# Patient Record
Sex: Female | Born: 1970 | Race: Black or African American | Hispanic: No | Marital: Married | Smoking: Never smoker
Health system: Southern US, Community
[De-identification: ages and names within clinical notes are randomized; demographics above are authoritative.]

## PROBLEM LIST (undated history)

## (undated) DIAGNOSIS — D649 Anemia, unspecified: Secondary | ICD-10-CM

## (undated) DIAGNOSIS — Z973 Presence of spectacles and contact lenses: Secondary | ICD-10-CM

## (undated) DIAGNOSIS — N939 Abnormal uterine and vaginal bleeding, unspecified: Secondary | ICD-10-CM

## (undated) DIAGNOSIS — K219 Gastro-esophageal reflux disease without esophagitis: Secondary | ICD-10-CM

## (undated) DIAGNOSIS — R51 Headache: Secondary | ICD-10-CM

## (undated) DIAGNOSIS — M26609 Unspecified temporomandibular joint disorder, unspecified side: Secondary | ICD-10-CM

## (undated) DIAGNOSIS — J309 Allergic rhinitis, unspecified: Secondary | ICD-10-CM

## (undated) HISTORY — DX: Headache: R51

---

## 1997-05-27 HISTORY — PX: DILATION AND CURETTAGE OF UTERUS: SHX78

## 2000-06-13 ENCOUNTER — Encounter: Payer: Self-pay | Admitting: *Deleted

## 2000-06-13 ENCOUNTER — Ambulatory Visit (HOSPITAL_COMMUNITY): Admission: RE | Admit: 2000-06-13 | Discharge: 2000-06-13 | Payer: Self-pay | Admitting: *Deleted

## 2000-09-26 ENCOUNTER — Inpatient Hospital Stay (HOSPITAL_COMMUNITY): Admission: AD | Admit: 2000-09-26 | Discharge: 2000-09-26 | Payer: Self-pay | Admitting: *Deleted

## 2000-10-12 ENCOUNTER — Inpatient Hospital Stay (HOSPITAL_COMMUNITY): Admission: AD | Admit: 2000-10-12 | Discharge: 2000-10-12 | Payer: Self-pay | Admitting: *Deleted

## 2000-10-21 ENCOUNTER — Inpatient Hospital Stay (HOSPITAL_COMMUNITY): Admission: AD | Admit: 2000-10-21 | Discharge: 2000-10-24 | Payer: Self-pay | Admitting: *Deleted

## 2004-04-13 ENCOUNTER — Ambulatory Visit: Payer: Self-pay | Admitting: Internal Medicine

## 2004-05-08 ENCOUNTER — Ambulatory Visit: Payer: Self-pay | Admitting: Internal Medicine

## 2005-02-15 ENCOUNTER — Ambulatory Visit: Payer: Self-pay | Admitting: Internal Medicine

## 2005-03-11 ENCOUNTER — Other Ambulatory Visit: Admission: RE | Admit: 2005-03-11 | Discharge: 2005-03-11 | Payer: Self-pay | Admitting: Obstetrics and Gynecology

## 2006-01-17 ENCOUNTER — Ambulatory Visit: Payer: Self-pay | Admitting: Internal Medicine

## 2006-04-22 ENCOUNTER — Ambulatory Visit: Payer: Self-pay | Admitting: Internal Medicine

## 2006-07-04 ENCOUNTER — Encounter: Admission: RE | Admit: 2006-07-04 | Discharge: 2006-07-04 | Payer: Self-pay | Admitting: Obstetrics and Gynecology

## 2006-08-06 ENCOUNTER — Ambulatory Visit: Payer: Self-pay | Admitting: Internal Medicine

## 2006-08-06 LAB — CONVERTED CEMR LAB
ALT: 13 units/L (ref 0–40)
AST: 18 units/L (ref 0–37)
Albumin: 3.6 g/dL (ref 3.5–5.2)
Alkaline Phosphatase: 94 units/L (ref 39–117)
BUN: 9 mg/dL (ref 6–23)
Basophils Absolute: 0.1 10*3/uL (ref 0.0–0.1)
Basophils Relative: 0.9 % (ref 0.0–1.0)
Bilirubin Urine: NEGATIVE
Bilirubin, Direct: 0.1 mg/dL (ref 0.0–0.3)
CO2: 32 meq/L (ref 19–32)
Calcium: 8.8 mg/dL (ref 8.4–10.5)
Chloride: 101 meq/L (ref 96–112)
Creatinine, Ser: 0.8 mg/dL (ref 0.4–1.2)
Eosinophils Absolute: 0.1 10*3/uL (ref 0.0–0.6)
Eosinophils Relative: 1 % (ref 0.0–5.0)
GFR calc Af Amer: 104 mL/min
GFR calc non Af Amer: 86 mL/min
Glucose, Bld: 93 mg/dL (ref 70–99)
H Pylori IgG: NEGATIVE
HCT: 38.2 % (ref 36.0–46.0)
Hemoglobin: 13 g/dL (ref 12.0–15.0)
Ketones, ur: NEGATIVE mg/dL
Leukocytes, UA: NEGATIVE
Lymphocytes Relative: 20.7 % (ref 12.0–46.0)
MCHC: 34.1 g/dL (ref 30.0–36.0)
MCV: 88 fL (ref 78.0–100.0)
Monocytes Absolute: 0.8 10*3/uL — ABNORMAL HIGH (ref 0.2–0.7)
Monocytes Relative: 11.1 % — ABNORMAL HIGH (ref 3.0–11.0)
Neutro Abs: 4.6 10*3/uL (ref 1.4–7.7)
Neutrophils Relative %: 66.3 % (ref 43.0–77.0)
Nitrite: NEGATIVE
Platelets: 396 10*3/uL (ref 150–400)
Potassium: 3.7 meq/L (ref 3.5–5.1)
RBC: 4.34 M/uL (ref 3.87–5.11)
RDW: 12.4 % (ref 11.5–14.6)
Sed Rate: 40 mm/hr — ABNORMAL HIGH (ref 0–25)
Sodium: 139 meq/L (ref 135–145)
Specific Gravity, Urine: 1.02 (ref 1.000–1.03)
Total Bilirubin: 0.6 mg/dL (ref 0.3–1.2)
Total Protein, Urine: NEGATIVE mg/dL
Total Protein: 7.2 g/dL (ref 6.0–8.3)
Urine Glucose: NEGATIVE mg/dL
Urobilinogen, UA: 1 (ref 0.0–1.0)
WBC: 7.1 10*3/uL (ref 4.5–10.5)
hCG, Beta Chain, Quant, S: <0.5 milliintl units/mL
pH: 7.5 (ref 5.0–8.0)

## 2006-09-23 ENCOUNTER — Ambulatory Visit: Payer: Self-pay | Admitting: Internal Medicine

## 2006-12-16 ENCOUNTER — Ambulatory Visit: Payer: Self-pay | Admitting: Internal Medicine

## 2007-04-04 ENCOUNTER — Encounter: Payer: Self-pay | Admitting: *Deleted

## 2007-04-04 DIAGNOSIS — J309 Allergic rhinitis, unspecified: Secondary | ICD-10-CM | POA: Insufficient documentation

## 2007-07-15 ENCOUNTER — Telehealth: Payer: Self-pay | Admitting: Internal Medicine

## 2007-12-16 ENCOUNTER — Ambulatory Visit: Payer: Self-pay | Admitting: Endocrinology

## 2007-12-16 DIAGNOSIS — H1010 Acute atopic conjunctivitis, unspecified eye: Secondary | ICD-10-CM

## 2008-05-05 ENCOUNTER — Ambulatory Visit: Payer: Self-pay | Admitting: Internal Medicine

## 2008-05-05 DIAGNOSIS — J018 Other acute sinusitis: Secondary | ICD-10-CM

## 2008-08-12 ENCOUNTER — Ambulatory Visit: Payer: Self-pay | Admitting: Internal Medicine

## 2008-08-12 DIAGNOSIS — J069 Acute upper respiratory infection, unspecified: Secondary | ICD-10-CM | POA: Insufficient documentation

## 2008-08-12 DIAGNOSIS — K219 Gastro-esophageal reflux disease without esophagitis: Secondary | ICD-10-CM | POA: Insufficient documentation

## 2008-08-12 DIAGNOSIS — K5900 Constipation, unspecified: Secondary | ICD-10-CM | POA: Insufficient documentation

## 2008-08-12 LAB — CONVERTED CEMR LAB
BUN: 10 mg/dL (ref 6–23)
CO2: 30 meq/L (ref 19–32)
Calcium: 9 mg/dL (ref 8.4–10.5)
Chloride: 107 meq/L (ref 96–112)
Creatinine, Ser: 0.9 mg/dL (ref 0.4–1.2)
GFR calc non Af Amer: 89.27 mL/min (ref >60)
Glucose, Bld: 93 mg/dL (ref 70–99)
Potassium: 4.5 meq/L (ref 3.5–5.1)
Sodium: 142 meq/L (ref 135–145)
TSH: 1.07 microintl units/mL (ref 0.35–5.50)

## 2009-01-03 ENCOUNTER — Telehealth: Payer: Self-pay | Admitting: Family Medicine

## 2009-02-02 ENCOUNTER — Telehealth: Payer: Self-pay | Admitting: Internal Medicine

## 2009-02-10 ENCOUNTER — Encounter: Payer: Self-pay | Admitting: Internal Medicine

## 2010-02-26 ENCOUNTER — Ambulatory Visit: Payer: Self-pay | Admitting: Internal Medicine

## 2010-02-26 ENCOUNTER — Telehealth (INDEPENDENT_AMBULATORY_CARE_PROVIDER_SITE_OTHER): Payer: Self-pay | Admitting: *Deleted

## 2010-02-26 DIAGNOSIS — R519 Headache, unspecified: Secondary | ICD-10-CM | POA: Insufficient documentation

## 2010-02-26 DIAGNOSIS — H5310 Unspecified subjective visual disturbances: Secondary | ICD-10-CM | POA: Insufficient documentation

## 2010-02-26 DIAGNOSIS — R51 Headache: Secondary | ICD-10-CM

## 2010-05-27 HISTORY — PX: BUNIONECTOMY: SHX129

## 2010-06-17 ENCOUNTER — Encounter: Payer: Self-pay | Admitting: Obstetrics and Gynecology

## 2010-06-26 NOTE — Progress Notes (Signed)
Summary: NEEDS OV TODAY - MONDAY  Phone Note Call from Patient Call back at [facility] Phone [phone]   Summary of Call: Pt c/o h/a this weekend. Also has c/o blurred vision in left eye. Pt left vm unsure if she needs office visit. Please help schedule w/plot today for eval. THANKS Initial call taken by: Lamar Sprinkles, CMA,  February 26, 2010 10:25 AM  Follow-up for Phone Call        Appt 10/3 @4 :15 Follow-up by: Verdell Face,  February 26, 2010 11:21 AM

## 2010-06-26 NOTE — Assessment & Plan Note (Signed)
Summary: per phone note/headache/cd   Vital Signs:  Patient profile:   40 year old female Temp:     98.4 degrees F oral Pulse rate:   82 / minute BP sitting:   140 / 90  (left arm) Cuff size:   large  Vitals Entered By: Lamar Sprinkles, CMA (February 26, 2010 4:32 PM) CC: H/a & blurred vision Comments Pt needs refill of aciphex and maxalt. Will check tetanus status with OBGYN. Pt does not think she has an allergy to ASPRIN, has taken asprin otc over the last year.    Primary Care Provider:  Tresa Garter MD  CC:  H/a & blurred vision.  History of Present Illness: C/o L eye pain x 2 d with a HA and blurred vision x2 d. HA on L started first; blurred vision developed in 1 day. No n/v  Allergies: 1)  ! 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    Physical Exam  General:  alert and well-developed.   Eyes:  vision grossly intact, pupils equal, pupils round, pupils reactive to light, no injection, and no nystagmus.  Not firm to palpation, NT. Contacts are in. Fluorescein dye exam with no erosion Nose:  no nasal discharge, mucosal pallor and mucosal erythema.   Mouth:  no pharyngeal erythema and fair dentition.   Lungs:  normal respiratory effort and normal breath sounds.   Heart:  normal rate and regular rhythm.   Skin:  turgor normal, color normal, no rashes, no suspicious lesions, no ecchymoses, no petechiae, no purpura, no ulcerations, and no edema.     Impression & Recommendations:  Problem # 1:  VISUAL CHANGES (ICD-368.10) L Assessment New Empiric antibiotic/steroid eye gtt; Acyclovir Remove  contacts Orders: Ophthalmology Referral (Ophthalmology) ASAP  Problem # 2:  HEADACHE (ICD-784.0) L Assessment: New  Her updated medication list for this problem includes:    Maxalt 10 Mg Tabs (Rizatriptan benzoate) .Marland Kitchen... Take as needed for migraines.    Hydrocodone-acetaminophen 5-325 Mg Tabs (Hydrocodone-acetaminophen) .Marland Kitchen... 1-2 by mouth two times a day as needed pain  Complete Medication List: 1)  Maxalt 10 Mg Tabs (Rizatriptan benzoate) .... Take as needed for migraines. 2)  Cetirizine Hcl 10 Mg Tabs (Cetirizine hcl) .Marland Kitchen.. 1 by mouth once daily as needed allergies 3)  Aciphex 20 Mg Tbec (Rabeprazole sodium) .... Take 1 tab every morning 4)  Acyclovir 400 Mg Tabs (Acyclovir) .Marland Kitchen.. 1 by mouth three times a day as needed herpes x 7 d 5)  Hydrocodone-acetaminophen 5-325 Mg Tabs (Hydrocodone-acetaminophen) .Marland Kitchen.. 1-2 by mouth two times a day as needed pain 6)  Tobradex 0.3-0.1 % Susp (Tobramycin-dexamethasone) .Marland Kitchen.. 1 gtt in l eye q 4 h  Patient Instructions: 1)  See your eye doctor tomorrow 2)  Remove contact lense 3)  Call if you are not better in a reasonable amount of time or if worse. Go to ER if feeling really bad!  Prescriptions: TOBRADEX 0.3-0.1 % SUSP (TOBRAMYCIN-DEXAMETHASONE) 1 gtt in L eye q 4 h  #1 x 0   Entered and Authorized by:   Tresa Garter MD   Signed by:   Tresa Garter MD on 02/26/2010   Method used:   Print  then Give to Patient   RxID:   [id number] HYDROCODONE-ACETAMINOPHEN 5-325 MG TABS (HYDROCODONE-ACETAMINOPHEN) 1-2 by mouth two times a day as needed pain  #20 x 0   Entered and Authorized by:   Tresa Garter MD   Signed by:   Tresa Garter MD on 02/26/2010   Method used:   Print then Give to Patient   RxID:   [id number] ACYCLOVIR 400 MG TABS (ACYCLOVIR) 1 by mouth three times a day as needed herpes x 7 d  #21 x 3   Entered and Authorized by:   Tresa Garter MD   Signed by:   Tresa Garter MD on 02/26/2010   Method  used:   Print then Give to Patient   RxID:   [id number]

## 2010-07-26 ENCOUNTER — Ambulatory Visit (INDEPENDENT_AMBULATORY_CARE_PROVIDER_SITE_OTHER)
Admission: RE | Admit: 2010-07-26 | Discharge: 2010-07-26 | Disposition: A | Discharge status: Home / Self Care | Payer: BC Managed Care – PPO | Source: Ambulatory Visit | Attending: Internal Medicine | Admitting: Internal Medicine

## 2010-07-26 ENCOUNTER — Encounter: Payer: Self-pay | Admitting: Internal Medicine

## 2010-07-26 ENCOUNTER — Other Ambulatory Visit: Payer: Self-pay | Admitting: Internal Medicine

## 2010-07-26 ENCOUNTER — Ambulatory Visit (INDEPENDENT_AMBULATORY_CARE_PROVIDER_SITE_OTHER): Payer: BC Managed Care – PPO | Admitting: Internal Medicine

## 2010-07-26 DIAGNOSIS — R05 Cough: Secondary | ICD-10-CM

## 2010-07-26 DIAGNOSIS — J209 Acute bronchitis, unspecified: Secondary | ICD-10-CM

## 2010-08-02 NOTE — Assessment & Plan Note (Signed)
Summary: sore throat/nws   Vital Signs:  Patient profile:   40 year old female Menstrual status:  regular LMP:     07/17/2010 Height:      63 inches Weight:      207 pounds BMI:     36.80 O2 Sat:      99 % on Room air Temp:     98.6 degrees F oral Pulse rate:   94 / minute Pulse rhythm:   regular Resp:     16 per minute BP sitting:   112 / 76  (left arm) Cuff size:   large  Vitals Entered By: Rock Nephew CMA (July 26, 2010 3:45 PM)  Nutrition Counseling: Patient's BMI is greater than 25 and therefore counseled on weight management options.  O2 Flow:  Room air CC: Patient c/o congestion, cough , fatigue and nausea x last sunday, URI symptoms Is Patient Diabetic? No Pain Assessment Patient in pain? no       Does patient need assistance? Functional Status Self care Ambulation Normal LMP (date): 07/17/2010     Menstrual Status regular Enter LMP: 07/17/2010   Primary Care Provider:  Georgina Quint Plotnikov MD  CC:  Patient c/o congestion, cough , fatigue and nausea x last sunday, and URI symptoms.  History of Present Illness:  URI Symptoms      This is a 40 year old woman who presents with URI symptoms.  The symptoms began 2 weeks ago.  The severity is described as moderate.  The patient reports nasal congestion, sore throat, and productive cough, but denies clear nasal discharge, purulent nasal discharge, dry cough, earache, and sick contacts.  Associated symptoms include low-grade fever (<100.5 degrees).  The patient denies stiff neck, dyspnea, wheezing, rash, vomiting, diarrhea, use of an antipyretic, and response to antipyretic.  The patient also reports muscle aches and severe fatigue.  The patient denies itchy throat, sneezing, seasonal symptoms, response to antihistamine, and headache.  Risk factors for Strep sinusitis include double sickening.  The patient denies the following risk factors for Strep sinusitis: unilateral facial pain, unilateral nasal discharge,  poor response to decongestant, tooth pain, Strep exposure, tender adenopathy, and absence of cough.    Preventive Screening-Counseling & Management  Alcohol-Tobacco     Smoking Status: never     Passive Smoke Exposure: no  Hep-HIV-STD-Contraception     HIV Risk: no      Drug Use:  no.    Medications Prior to Update: 1)  Maxalt 10 Mg  Tabs (Rizatriptan Benzoate) .... Take As Needed For Migraines. 2)  Cetirizine Hcl 10 Mg Tabs (Cetirizine Hcl) .Marland Kitchen.. 1 By Mouth Once Daily As Needed Allergies 3)  Aciphex 20 Mg Tbec (Rabeprazole Sodium) .... Take 1 Tab Every Morning 4)  Acyclovir 400 Mg Tabs (Acyclovir) .Marland Kitchen.. 1 By Mouth Three Times A Day As Needed Herpes X 7 D 5)  Hydrocodone-Acetaminophen 5-325 Mg Tabs (Hydrocodone-Acetaminophen) .Marland Kitchen.. 1-2 By Mouth Two Times A Day As Needed Pain 6)  Tobradex 0.3-0.1 % Susp (Tobramycin-Dexamethasone) .Marland Kitchen.. 1 Gtt in L Eye Q 4 H  Current Medications (verified): 1)  Maxalt 10 Mg  Tabs (Rizatriptan Benzoate) .... Take As Needed For Migraines. 2)  Aciphex 20 Mg Tbec (Rabeprazole Sodium) .... Take 1 Tab Every Morning 3)  Zutripro 60-4-5 Mg/3ml Soln (Pseudoeph-Chlorphen-Hydrocod) .... 5-10 Ml By Mouth Qid As Needed For Cough and Congestion 4)  Zithromax Tri-Pak 500 Mg Tab (Azithromycin) .... Takeone By Mouth Once Daily For 3 Days  Allergies (verified): 1)  !  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 mother with HTN, MI father died with stroke 59yo  Social History: Reviewed history from 08/12/2008 and no changes required. 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  The patient denies anorexia, weight loss, weight gain, chest pain, syncope, dyspnea on exertion, peripheral edema, headaches, hemoptysis, abdominal pain, hematuria, suspicious skin lesions, transient blindness, enlarged lymph nodes, and angioedema.    Physical Exam  General:  alert, well-developed, well-nourished, well-hydrated, appropriate dress, normal appearance, healthy-appearing, and overweight-appearing.   Head:  normocephalic, atraumatic, no abnormalities observed, and no abnormalities palpated.   Eyes:  vision grossly intact, pupils equal, and no injection.   Ears:  R ear normal and L ear normal.   Nose:  External nasal examination shows no deformity or inflammation. Nasal mucosa are pink and moist without lesions or exudates. Mouth:  Oral mucosa and oropharynx without lesions or exudates.  Teeth in good repair. Neck:  supple, full ROM, no masses, no thyromegaly, no thyroid nodules or tenderness, no JVD, normal carotid upstroke, no carotid bruits, no cervical lymphadenopathy, and no neck tenderness.   Lungs:  Normal respiratory effort, chest expands symmetrically. Lungs are clear to auscultation, no crackles or wheezes. Heart:  Normal rate and regular rhythm. S1 and S2 normal without gallop, murmur, click, rub or other extra sounds. Abdomen:  Bowel sounds positive,abdomen soft and non-tender without masses, organomegaly or hernias noted. Msk:  No deformity or scoliosis noted of thoracic or lumbar spine.   Pulses:  R and L carotid,radial,femoral,dorsalis pedis and posterior  tibial pulses are full and equal bilaterally Extremities:  No clubbing, cyanosis, edema, or deformity noted with normal full range of motion of all joints.   Neurologic:  No cranial nerve deficits noted. Station and gait are normal. Plantar reflexes are down-going bilaterally. DTRs are symmetrical throughout. Sensory, motor and coordinative functions appear intact. Skin:  Intact without suspicious lesions or rashes Cervical Nodes:  no anterior cervical adenopathy and no posterior cervical adenopathy.   Axillary Nodes:  no R axillary adenopathy and no L axillary adenopathy.   Inguinal Nodes:  no R inguinal adenopathy and no L inguinal adenopathy.   Psych:  Cognition and judgment appear intact. Alert and cooperative with normal attention span and concentration. No apparent delusions, illusions, hallucinations   Impression & Recommendations:  Problem # 1:  COUGH (ICD-786.2) Assessment New will check for pna, mass, edema, etc. Orders: T-2 View CXR (71020TC)  Problem # 2:  BRONCHITIS-ACUTE (ICD-466.0) Assessment: New  Her updated medication list for this problem includes:    Zutripro 60-4-5 Mg/42ml Soln (Pseudoeph-chlorphen-hydrocod) .Marland Kitchen... 5-10 ml by mouth qid as needed for cough and congestion    Zithromax Tri-pak 500 Mg Tab (Azithromycin) .Marland Kitchen... Takeone by mouth once daily for 3 days  Take antibiotics and other medications  as directed. Encouraged to push clear liquids, get enough rest, and take acetaminophen as needed. To be seen in 5-7 days if no improvement, sooner if worse.  Complete Medication List: 1)  Maxalt 10 Mg Tabs (Rizatriptan benzoate) .... Take as needed for migraines. 2)  Aciphex 20 Mg Tbec (Rabeprazole sodium) .... Take 1 tab every morning 3)  Zutripro 60-4-5 Mg/62ml Soln (Pseudoeph-chlorphen-hydrocod) .... 5-10 ml by mouth qid as needed for cough and congestion 4)  Zithromax Tri-pak 500 Mg Tab (Azithromycin) .... Takeone by mouth once daily for 3 days  Patient  Instructions: 1)  Please schedule a follow-up appointment in 2 weeks. 2)  Take your antibiotic as prescribed until ALL of it is gone, but stop if you develop a rash or swelling and contact our office as soon as possible. 3)  Acute bronchitis symptoms for less than 10 days are not helped by antibiotics. take over the counter cough medications. call if no improvment in  5-7 days, sooner if increasing cough, fever, or new symptoms( shortness of breath, chest pain). Prescriptions: ZITHROMAX TRI-PAK 500 MG TAB (AZITHROMYCIN) Takeone by mouth once daily for 3 days  #3 x 0   Entered and Authorized by:   Etta Grandchild MD   Signed by:   Etta Grandchild MD on 07/26/2010   Method used:   Print then Give to Patient   RxID:   [id number] ZUTRIPRO 60-4-5 MG/5ML SOLN (PSEUDOEPH-CHLORPHEN-HYDROCOD) 5-10 ml by mouth QID as needed for cough and congestion  #8 ounces x 1   Entered and Authorized by:   Etta Grandchild MD   Signed by:   Etta Grandchild MD on 07/26/2010   Method used:   Print then Give to Patient   RxID:   [id number]    Orders Added: 1)  T-2 View CXR [71020TC] 2)  Est. Patient Level IV [16606]

## 2010-08-02 NOTE — Letter (Signed)
Summary: Out of Work  [facility]  [address] [place], [place] [address]   Phone: [phone]  Fax: [phone]    July 26, 2010   Employee:  KANDI BRUSSEAU    To Whom It May Concern:   For Medical reasons, please excuse the above named employee from work for the following dates:  Start:   07/26/10  End:   07/30/10  If you need additional information, please feel free to contact our office.         Sincerely,    Etta Grandchild MD

## 2010-10-12 NOTE — H&P (Signed)
[facility]  Patient:    [name] [name], MOOD                     MRN: [id number] Adm. Date:  [id number] Attending:  Deniece Ree                         History and Physical  HISTORY OF PRESENT ILLNESS:   The patient is a 40 year old, gravida 2, para 0, patient of Dr. Wiliam Ke at 38 weeks who presented with ruptured membranes on the morning of May 28, and in labor.  She was 2 cm, 80% vertex, minus 1 by nurse. At 8 a.m., she was 6-7 cm, 100% vertex, and minus 2, estimated fetal weight, 8 pounds.  Her contractions were hypotonic, so she was started on Pitocin stimulation.  She became fully dilated at 2:05 in the afternoon.  _____ to labor done for a couple of hours, and by 5:30 she had been pushing for more than two hours with vertex _____ 0 station, _____ +1 station.  It was decided to deliver by cesarean section per CPV.  PHYSICAL EXAMINATION:  GENERAL:                      Revealed a well-developed female in labor.  HEENT:                        Negative.  LUNGS:                        Clear.  HEART:                        Regular rhythm, no murmurs, no gallops.  BREASTS:                      Negative.  ABDOMEN:                      Term size with estimated fetal weight of 8 pounds.  PELVIC:                       As described above.  EXTREMITIES:                  Negative. DD:  10/22/00 TD:  10/22/00 Job: [id number]

## 2010-10-12 NOTE — Op Note (Signed)
[facility]  Patient:    [name] [name], [name] [name]                     MRN: [id number] Proc. Date: 10/21/00 Adm. Date:  [id number] Attending:  Deniece Ree                           Operative Report  PREOPERATIVE DIAGNOSIS:      Cephalopelvic disproportion.  SURGEON:                      Kathreen Cosier, M.D.  ANESTHESIA:                   Epidural.  DESCRIPTION OF PROCEDURE:     The patient was placed on the operating table in the supine position, the abdomen was prepped and draped, and bladder emptied with a Foley catheter.  A transverse suprapubic incision made and carried down to the rectus fascia.  The fascia was cleanly incised the length of the incision.  The recti muscles retracted laterally.  The peritoneum incised longitudinally.  A transverse incision was made in the visceroperitoneum above the bladder and the bladder mobilized inferiorly.  A transverse lower uterine incision was made.  The fluid was clear.  The infant was delivered from the OP position of a female, Apgars 9 and 9, weighing 8 pounds 14 ounces. The team was in attendance.  The placenta was posterior and removed manually. The uterus cleaned with dry laps.  Uterine incision was closed in one layer with continuous suture of #1 chromic catgut.  Hemostasis was satisfactory. The bladder flap was reattached with chromic.  Uterus was contracted. The tubes and ovaries normal.  The abdomen was closed in layers. The peritoneum with continuous suture of 0 chromic, the fascia with continuous suture of 0 Dexon.  The skin was closed with subcuticular suture of 3-0 plain.  The patient tolerated the procedure well and was taken to recovery in good condition. DD:  10/22/00 TD:  10/22/00 Job: [id number]

## 2010-10-12 NOTE — Discharge Summary (Signed)
[facility]  Patient:    [name] [name], [name] [name]                     MRN: [id number] Adm. Date:  [id number] Disc. Date: [id number] Attending:  Deniece Ree                           Discharge Summary  SUMMARY:                      The patient is a gravida 2, para 0, abortus 1 whose estimated date of confinement is October 29, 2000 who was admitted in active labor.  Patient progressed to complete dilatation and pushed for approximately two hours at which time Dr. Gaynell Face covered for me and decided on a cesarean section because of cephalopelvic disproportion.  Patient underwent a low transverse cesarean section from which she tolerated the procedure well and delivered a female infant weighing 8 pounds 14 ounces.  Postoperatively this patient did very well without any complications and was discharged on the third postoperative day.  She was instructed on the possible complications and care following this type of surgery.  She was told to return to my office in four weeks for followup evaluation or to call me prior to that time should any problems arise. DD:  11/15/00 TD:  11/16/00 Job: [id number]

## 2011-04-05 ENCOUNTER — Ambulatory Visit (INDEPENDENT_AMBULATORY_CARE_PROVIDER_SITE_OTHER): Payer: BC Managed Care – PPO | Admitting: Internal Medicine

## 2011-04-05 ENCOUNTER — Encounter: Payer: Self-pay | Admitting: Internal Medicine

## 2011-04-05 VITALS — BP 120/84 | HR 88 | Temp 99.5°F | Ht 62.0 in | Wt 236.1 lb

## 2011-04-05 DIAGNOSIS — F410 Panic disorder [episodic paroxysmal anxiety] without agoraphobia: Secondary | ICD-10-CM

## 2011-04-05 DIAGNOSIS — R079 Chest pain, unspecified: Secondary | ICD-10-CM

## 2011-04-05 DIAGNOSIS — M719 Bursopathy, unspecified: Secondary | ICD-10-CM

## 2011-04-05 DIAGNOSIS — M7550 Bursitis of unspecified shoulder: Secondary | ICD-10-CM

## 2011-04-05 DIAGNOSIS — M67919 Unspecified disorder of synovium and tendon, unspecified shoulder: Secondary | ICD-10-CM

## 2011-04-05 MED ORDER — MELOXICAM 15 MG PO TABS: 15.0000 mg | ORAL_TABLET | Freq: Every day | ORAL | Status: DC

## 2011-04-05 MED ORDER — ALPRAZOLAM 0.25 MG PO TABS: 0.2500 mg | ORAL_TABLET | Freq: Three times a day (TID) | ORAL | Status: DC | PRN

## 2011-04-05 NOTE — Patient Instructions (Signed)
It was good to see you today. Use meloxicam for shoulder and chest pain to treat bursitis symptoms Use Xanax as needed for panic attack and stress symptoms Your prescription(s) have been submitted to your pharmacy. Please take as directed and contact our office if you believe you are having problem(s) with the medication(s). If symptoms worse or unimproved, call Dr.Plotnikov for followup appointment

## 2011-04-05 NOTE — Progress Notes (Signed)
  Subjective:    Patient ID: [name] [name], female    DOB: 01/28/1971, 40 y.o.   MRN: [id number]  HPI  complains of chest pain and left shoulder pain Onset 5 days ago - constantly present without change-  Pain worse when moving upper extremity or when lying on left side. Symptoms worse at night - associated with slight shortness of breath and chest tightness sensation Precipitated by stress> ongoing separation and divorce process from spouse No radiation of pain past left shoulder and left anterior chest No neck pain, no weakness in hand, no falls or balance problem Denies trauma or overuse Has not tried over-the-counter medications, heat or massage  Past Medical History  Diagnosis Date  . ALLERGIC RHINITIS   . CONSTIPATION   . GERD   . Headache      Review of Systems  HENT: Negative for neck pain and neck stiffness.   Respiratory: Negative for cough and wheezing.   Cardiovascular: Negative for leg swelling.   See HPI above    Objective:   Physical Exam BP 120/84  Pulse 88  Temp(Src) 99.5 F (37.5 C) (Oral)  Ht 5\' 2"  (1.575 m)  Wt 236 lb 1.9 oz (107.103 kg)  BMI 43.19 kg/m2  SpO2 96% Wt Readings from Last 3 Encounters:  04/05/11 236 lb 1.9 oz (107.103 kg)  07/26/10 207 lb (93.895 kg)  08/12/08 206 lb 4 oz (93.554 kg)   Constitutional: She is overweight - but appears well-developed and well-nourished. Mild emotional distress. Neck: Normal range of motion. Neck supple. No JVD present. No thyromegaly present.  Cardiovascular: Normal rate, regular rhythm and normal heart sounds.  No murmur heard. No BLE edema. Chest wall - min tender over anterior shoulder - see Mskel Pulmonary/Chest: Effort normal and breath sounds normal. No respiratory distress. She has no wheezes. Musculoskeletal: L shoulder with mildly decreased range of motion on forward flexion, abduction, and internal rotation. Positive impingement signs. No decreased strength with stressing of rotator cuff.  Pain with crossed arm adduction. referred pain into distal deltoid. Tender over a.c. joint and subacromial.  Psychiatric: She has a depressed and anxious mood and affect. Her behavior is normal. Judgment and thought content normal.   EKG: NSR @ 83 bpm - no ischemic change or arrythmia      Assessment & Plan:  Left shoulder pain - suspect bursitis - meloxicam prescribed, conservative care recommended  Anxiety with panic attack - likely cause of chest pain as combined with problem #1 above - reassurance provided, low dose of limited Xanax provided to use for same symptoms during separation and divorce process. Patient to call primary care physician if symptoms worse or unimproved

## 2011-05-02 ENCOUNTER — Telehealth: Payer: Self-pay | Admitting: *Deleted

## 2011-05-02 ENCOUNTER — Ambulatory Visit (INDEPENDENT_AMBULATORY_CARE_PROVIDER_SITE_OTHER): Payer: BC Managed Care – PPO | Admitting: Endocrinology

## 2011-05-02 ENCOUNTER — Encounter: Payer: Self-pay | Admitting: Endocrinology

## 2011-05-02 VITALS — BP 122/82 | HR 82 | Temp 99.1°F

## 2011-05-02 DIAGNOSIS — J069 Acute upper respiratory infection, unspecified: Secondary | ICD-10-CM

## 2011-05-02 MED ORDER — CEFUROXIME AXETIL 250 MG PO TABS: 250.0000 mg | ORAL_TABLET | Freq: Two times a day (BID) | ORAL | Status: AC

## 2011-05-02 NOTE — Patient Instructions (Signed)
i have sent a prescription to your pharmacy. I hope you feel better soon.  If you don't feel better by next week, please call your doctor.

## 2011-05-02 NOTE — Progress Notes (Signed)
  Subjective:    Patient ID: [name] [name], female    DOB: 11/19/1970, 40 y.o.   MRN: [id number]  HPI Pt states 1 day of moderate pain at the right side of the throat and assoc stiff neck.  She has bilat tinnitus. Past Medical History  Diagnosis Date  . ALLERGIC RHINITIS   . CONSTIPATION   . GERD   . Headache     Past Surgical History  Procedure Date  . Dilation and curettage of uterus 1999  . Cesarean section     History   Social History  . Marital Status: Married    Spouse Name: N/A    Number of Children: N/A  . Years of Education: N/A   Occupational History  . Not on file.   Social History Main Topics  . Smoking status: Never Smoker   . Smokeless tobacco: Not on file  . Alcohol Use: No  . Drug Use: No  . Sexually Active: Not on file   Other Topics Concern  . Not on file   Social History Narrative  . No narrative on file    Current Outpatient Prescriptions on File Prior to Visit  Medication Sig Dispense Refill  . ALPRAZolam (XANAX) 0.25 MG tablet Take 1 tablet (0.25 mg total) by mouth 3 (three) times daily as needed for anxiety.  30 tablet  0  . meloxicam (MOBIC) 15 MG tablet Take 1 tablet (15 mg total) by mouth daily. X 1 week, then as needed for pain  30 tablet  0  . phentermine 37.5 MG capsule Take 37.5 mg by mouth every morning.          Allergies  Allergen Reactions  . Aspirin     Family History  Problem Relation Age of Onset  . Hypertension Mother   . Stroke Father     BP 122/82  Pulse 82  Temp(Src) 99.1 F (37.3 C) (Oral)  SpO2 97% Review of Systems Denies fever and cough    Objective:   Physical Exam VITAL SIGNS:  See vs page GENERAL: no distress head: no deformity eyes: no periorbital swelling, no proptosis external nose and ears are normal mouth: no lesion seen. Both eac's and tm's are slightly red. NECK: supple.  Full rom.  There is no palpable thyroid enlargement.  No thyroid nodule is palpable.  No palpable  lymphadenopathy at the anterior neck.     Assessment & Plan:  Glenford Peers, new

## 2011-05-15 NOTE — Telephone Encounter (Signed)
OV w/Dr Everardo All.

## 2011-09-16 ENCOUNTER — Ambulatory Visit (INDEPENDENT_AMBULATORY_CARE_PROVIDER_SITE_OTHER): Payer: BC Managed Care – PPO | Admitting: Internal Medicine

## 2011-09-16 ENCOUNTER — Encounter: Payer: Self-pay | Admitting: Internal Medicine

## 2011-09-16 VITALS — BP 132/90 | HR 92 | Temp 98.6°F | Resp 16 | Wt 253.0 lb

## 2011-09-16 DIAGNOSIS — J309 Allergic rhinitis, unspecified: Secondary | ICD-10-CM

## 2011-09-16 MED ORDER — PREDNISONE 10 MG PO TABS: 10.0000 mg | ORAL_TABLET | Freq: Every day | ORAL | Status: DC

## 2011-09-16 MED ORDER — FLUTICASONE PROPIONATE 50 MCG/ACT NA SUSP: 1.0000 | Freq: Two times a day (BID) | NASAL | Status: DC

## 2011-09-16 NOTE — Patient Instructions (Signed)
Seasonal allergies - no evidence of infection. Plan prednisone 30 mg today, 20 mg daily x 3, 10 mg daily x 3 and stop. Start fluticasone 1 spray to each nostril twice a day. May use the afrin type spray 15 minutes before use. Sudafed 30 mg three times a day for 3 days.                  Allergic Rhinitis Allergic rhinitis is when the mucous membranes in the nose respond to allergens. Allergens are particles in the air that cause your body to have an allergic reaction. This causes you to release allergic antibodies. Through a chain of events, these eventually cause you to release histamine into the blood stream (hence the use of antihistamines). Although meant to be protective to the body, it is this release that causes your discomfort, such as frequent sneezing, congestion and an itchy runny nose.   CAUSES   The pollen allergens may come from grasses, trees, and weeds. This is seasonal allergic rhinitis, or "hay fever." Other allergens cause year-round allergic rhinitis (perennial allergic rhinitis) such as house dust mite allergen, pet dander and mold spores.   SYMPTOMS    Nasal stuffiness (congestion).   Runny, itchy nose with sneezing and tearing of the eyes.   There is often an itching of the mouth, eyes and ears.  It cannot be cured, but it can be controlled with medications. DIAGNOSIS   If you are unable to determine the offending allergen, skin or blood testing may find it. TREATMENT    Avoid the allergen.   Medications and allergy shots (immunotherapy) can help.   Hay fever may often be treated with antihistamines in pill or nasal spray forms. Antihistamines block the effects of histamine. There are over-the-counter medicines that may help with nasal congestion and swelling around the eyes. Check with your caregiver before taking or giving this medicine.  If the treatment above does not work, there are many new medications your caregiver can prescribe. Stronger medications may be used  if initial measures are ineffective. Desensitizing injections can be used if medications and avoidance fails. Desensitization is when a patient is given ongoing shots until the body becomes less sensitive to the allergen. Make sure you follow up with your caregiver if problems continue. SEEK MEDICAL CARE IF:    You develop fever (more than 100.5 F (38.1 C).   You develop a cough that does not stop easily (persistent).   You have shortness of breath.   You start wheezing.   Symptoms interfere with normal daily activities.  Document Released: 02/05/2001 Document Revised: 05/02/2011 Document Reviewed: 08/17/2008 [facility] Patient Information 2012 [place], [place].

## 2011-09-16 NOTE — Assessment & Plan Note (Signed)
Seasonal flare with nasal congestion.  Plan - prednisone orally: 30 mg x 1, 20 mg qd x 3, 10 mg qd x 3 and off           Fluticasone 1 spray to each nostril bid-may precede with afrin spray           Sudafed 30mg  tid

## 2011-09-16 NOTE — Progress Notes (Signed)
  Subjective:    Patient ID: [name] [name], female    DOB: Nov 07, 1970, 41 y.o.   MRN: [id number]  HPI [name] [name] presents with a week long h/o sinus congestion with nasal blockage that makes her feel short of breath. She has been using generic afrin which will help for an hour. No rhinorrhea, no cough, she admits to pressure like discomfort in the sinuses, no SOB, no fever documented fever. She has severe seasonal allergies: she is taking loratadine 10 mg once a day. She has failed taking sudafed. She has used nasonex in the past with good results.   Past Medical History  Diagnosis Date  . ALLERGIC RHINITIS   . CONSTIPATION   . GERD   . Headache    Past Surgical History  Procedure Date  . Dilation and curettage of uterus 1999  . Cesarean section    Family History  Problem Relation Age of Onset  . Hypertension Mother   . Stroke Father    History   Social History  . Marital Status: Married    Spouse Name: N/A    Number of Children: N/A  . Years of Education: N/A   Occupational History  . Not on file.   Social History Main Topics  . Smoking status: Never Smoker   . Smokeless tobacco: Not on file  . Alcohol Use: No  . Drug Use: No  . Sexually Active: Not on file   Other Topics Concern  . Not on file   Social History Narrative  . No narrative on file       Review of Systems System review is negative for any constitutional, cardiac, pulmonary, GI or neuro symptoms or complaints other than as described in the HPI.     Objective:   Physical Exam Filed Vitals:   09/16/11 0941  BP: 132/90  Pulse: 92  Temp: 98.6 F (37 C)  Resp: 16   Gen;'l- overweifght AA woman in no distress HEENT- no sinus tenderness, turbinates swollwen, throat clear Chest - clear to A&P       Assessment & Plan:

## 2012-04-27 ENCOUNTER — Emergency Department (HOSPITAL_COMMUNITY): Payer: BC Managed Care – PPO

## 2012-04-27 ENCOUNTER — Encounter (HOSPITAL_COMMUNITY): Payer: Self-pay | Admitting: Emergency Medicine

## 2012-04-27 ENCOUNTER — Emergency Department (HOSPITAL_COMMUNITY)
Admission: EM | Admit: 2012-04-27 | Discharge: 2012-04-27 | Disposition: A | Discharge status: Home / Self Care | Payer: BC Managed Care – PPO | Attending: Emergency Medicine | Admitting: Emergency Medicine

## 2012-04-27 DIAGNOSIS — J309 Allergic rhinitis, unspecified: Secondary | ICD-10-CM | POA: Insufficient documentation

## 2012-04-27 DIAGNOSIS — R079 Chest pain, unspecified: Secondary | ICD-10-CM

## 2012-04-27 DIAGNOSIS — Z3202 Encounter for pregnancy test, result negative: Secondary | ICD-10-CM | POA: Insufficient documentation

## 2012-04-27 DIAGNOSIS — R0789 Other chest pain: Secondary | ICD-10-CM | POA: Insufficient documentation

## 2012-04-27 DIAGNOSIS — Z8719 Personal history of other diseases of the digestive system: Secondary | ICD-10-CM | POA: Insufficient documentation

## 2012-04-27 LAB — URINALYSIS, ROUTINE W REFLEX MICROSCOPIC
Leukocytes, UA: NEGATIVE
Nitrite: NEGATIVE
Specific Gravity, Urine: 1.027 (ref 1.005–1.030)
pH: 5.5 (ref 5.0–8.0)

## 2012-04-27 LAB — BASIC METABOLIC PANEL
CO2: 28 mEq/L (ref 19–32)
Chloride: 96 mEq/L (ref 96–112)
Potassium: 3.8 mEq/L (ref 3.5–5.1)
Sodium: 132 mEq/L — ABNORMAL LOW (ref 135–145)

## 2012-04-27 LAB — POCT PREGNANCY, URINE: Preg Test, Ur: NEGATIVE

## 2012-04-27 LAB — CBC
MCV: 85.2 fL (ref 78.0–100.0)
Platelets: 337 10*3/uL (ref 150–400)
RBC: 4.11 MIL/uL (ref 3.87–5.11)
WBC: 7.1 10*3/uL (ref 4.0–10.5)

## 2012-04-27 LAB — PRO B NATRIURETIC PEPTIDE: Pro B Natriuretic peptide (BNP): 9.2 pg/mL (ref 0–125)

## 2012-04-27 LAB — URINE MICROSCOPIC-ADD ON

## 2012-04-27 MED ORDER — NITROGLYCERIN 0.4 MG SL SUBL
0.4000 mg | SUBLINGUAL_TABLET | SUBLINGUAL | Status: DC | PRN
  Administered 2012-04-27 (×3): 0.4 mg via SUBLINGUAL
  Filled 2012-04-27: qty 25

## 2012-04-27 MED ORDER — DIPHENHYDRAMINE HCL 25 MG PO CAPS
25.0000 mg | ORAL_CAPSULE | Freq: Four times a day (QID) | ORAL | Status: DC | PRN
  Administered 2012-04-27: 25 mg via ORAL
  Filled 2012-04-27: qty 1

## 2012-04-27 MED ORDER — DEXAMETHASONE SODIUM PHOSPHATE 10 MG/ML IJ SOLN
10.0000 mg | Freq: Once | INTRAMUSCULAR | Status: DC
  Filled 2012-04-27: qty 1

## 2012-04-27 MED ORDER — ACETAMINOPHEN 325 MG PO TABS
ORAL_TABLET | ORAL | Status: AC
  Filled 2012-04-27: qty 2

## 2012-04-27 MED ORDER — IBUPROFEN 600 MG PO TABS: 600.0000 mg | ORAL_TABLET | Freq: Four times a day (QID) | ORAL | Status: DC | PRN

## 2012-04-27 MED ORDER — METOCLOPRAMIDE HCL 5 MG/ML IJ SOLN
10.0000 mg | Freq: Once | INTRAMUSCULAR | Status: DC
  Filled 2012-04-27: qty 2

## 2012-04-27 MED ORDER — IOHEXOL 350 MG/ML SOLN
100.0000 mL | Freq: Once | INTRAVENOUS | Status: AC | PRN
  Administered 2012-04-27: 100 mL via INTRAVENOUS

## 2012-04-27 MED ORDER — ASPIRIN 81 MG PO CHEW
162.0000 mg | CHEWABLE_TABLET | Freq: Once | ORAL | Status: AC
  Administered 2012-04-27: 162 mg via ORAL
  Filled 2012-04-27: qty 2

## 2012-04-27 NOTE — ED Notes (Signed)
Patient refused decadron and reglan IV.  MD notified.  She reported she would just like some Tylenol.

## 2012-04-27 NOTE — ED Provider Notes (Signed)
History     CSN: [id number]  Arrival date & time 04/27/12  6295   First MD Initiated Contact with Patient 04/27/12 [phone]      Chief Complaint  Patient presents with  . Chest Pain    (Consider location/radiation/quality/duration/timing/severity/associated sxs/prior treatment) HPI Comments: 41 y/o woman comes in with cc of chest pain. Pt reports that she woke up this am, and was strethching, when she started having left sided ,pressure like chest pain. The chest pain was sharp, and now just pressure like, no radiation. The pain is worse with movement, no cough, not pleuritic and not exertional. No hx of similar complain in the past .She has no medical hx, does have premature CAD in the family - mother at age 84s. No n/v/f/c/sob. No hx of PE, DVT and no risk factors for the same.  Patient is a 41 y.o. female presenting with chest pain. The history is provided by the patient.  Chest Pain Pertinent negatives for primary symptoms include no shortness of breath, no abdominal pain, no nausea and no vomiting.     Past Medical History  Diagnosis Date  . ALLERGIC RHINITIS   . CONSTIPATION   . GERD   . Headache     Past Surgical History  Procedure Date  . Dilation and curettage of uterus 1999  . Cesarean section     Family History  Problem Relation Age of Onset  . Hypertension Mother   . Stroke Father     History  Substance Use Topics  . Smoking status: Never Smoker   . Smokeless tobacco: Not on file  . Alcohol Use: No     Comment: weekly    OB History    Grav Para Term Preterm Abortions TAB SAB Ect Mult Living                  Review of Systems  Constitutional: Negative for activity change.  HENT: Negative for neck pain.   Respiratory: Negative for shortness of breath.   Cardiovascular: Positive for chest pain.  Gastrointestinal: Negative for nausea, vomiting and abdominal pain.  Genitourinary: Negative for dysuria.  Neurological: Negative for headaches.     Allergies  Aspirin and Codeine  Home Medications   Current Outpatient Rx  Name  Route  Sig  Dispense  Refill  . BC HEADACHE POWDER PO   Oral   Take 1 packet by mouth as needed. For pain         . VICKS VAPORUB 4.7-1.2-2.6 % EX OINT   Apply externally   Apply 1 application topically as needed. Congestion         . CETIRIZINE HCL 10 MG PO TABS   Oral   Take 10 mg by mouth as needed.         . IBUPROFEN 400 MG PO TABS   Oral   Take 400 mg by mouth every 6 (six) hours as needed. As needed for fever/pain           BP 130/77  Pulse 76  Temp 99 F (37.2 C) (Oral)  Resp 18  SpO2 100%  LMP 04/16/2012  Physical Exam  Constitutional: She is oriented to person, place, and time. She appears well-developed.  HENT:  Head: Normocephalic and atraumatic.  Eyes: Conjunctivae normal and EOM are normal. Pupils are equal, round, and reactive to light.  Neck: Normal range of motion. Neck supple.  Cardiovascular: Normal rate, regular rhythm and normal heart sounds.  Exam reveals no friction rub.  Pulmonary/Chest: Effort normal and breath sounds normal. No respiratory distress. She exhibits no tenderness.       Pain not reproduced with palpation.  Abdominal: Soft. Bowel sounds are normal. She exhibits no distension. There is no tenderness. There is no rebound and no guarding.  Neurological: She is alert and oriented to person, place, and time.  Skin: Skin is warm and dry.    ED Course  Procedures (including critical care time)  Labs Reviewed  CBC - Abnormal; Notable for the following:    HCT 35.0 (*)     All other components within normal limits  BASIC METABOLIC PANEL - Abnormal; Notable for the following:    Sodium 132 (*)     Glucose, Bld 105 (*)     GFR calc non Af Amer 74 (*)     GFR calc Af Amer 86 (*)     All other components within normal limits  URINALYSIS, ROUTINE W REFLEX MICROSCOPIC - Abnormal; Notable for the following:    Hgb urine dipstick TRACE (*)      All other components within normal limits  URINE MICROSCOPIC-ADD ON - Abnormal; Notable for the following:    Bacteria, UA FEW (*)     All other components within normal limits  PRO B NATRIURETIC PEPTIDE  TROPONIN I  POCT PREGNANCY, URINE  TROPONIN I   No results found.   No diagnosis found.    MDM   Date: 04/27/2012  Rate: 73  Rhythm: normal sinus rhythm  QRS Axis: normal  Intervals: normal  ST/T Wave abnormalities: normal  Conduction Disutrbances:none  Narrative Interpretation:   Old EKG Reviewed: none available RSR COMPLEX IN LEAD V1, V2  Differential diagnosis includes: ACS syndrome CHF exacerbation Valvular disorder Myocarditis Pericarditis Pericardial effusion Pneumonia Pleural effusion Pulmonary edema PE Anemia Musculoskeletal pain  Pt comes in with cc of chest pain, left sided, sudden onset. Only cardiac risk factor is premature CAD in the family. Had RSR prime - so possible right sided strain. Well score is 0.  The pain is atypical, but given family hx, and the RSR ptime, with get dimer and troponin x 2. If all workup is negative, only then we will discharge her.  Pt understands that if the workup is normal, she will be discharged, but she needs to see her pcp this week.       Derwood Kaplan, MD 04/27/12 1105

## 2012-04-27 NOTE — ED Notes (Signed)
Pt given second SL nitro, states pain is "about the same" rates 5/10 BP 126/78

## 2012-04-27 NOTE — ED Notes (Signed)
First nitro given SL, c/o substernal chest pain worse with movement rated 6/10 at this time. BP 121/78

## 2012-04-27 NOTE — ED Notes (Signed)
Third nitro given at this time, states pain remains the same. Rates 5/10 BP 111/76

## 2012-04-27 NOTE — ED Notes (Signed)
Pt presenting to ed with c/o chest pain onset this morning. Pt states she has some dizziness, and weakness. Pt states pain is on her left side. Pt denies nausea and vomiting at this time. Pt states she also feels weak

## 2012-05-05 ENCOUNTER — Ambulatory Visit (INDEPENDENT_AMBULATORY_CARE_PROVIDER_SITE_OTHER): Payer: BC Managed Care – PPO | Admitting: Internal Medicine

## 2012-05-05 ENCOUNTER — Encounter: Payer: Self-pay | Admitting: Internal Medicine

## 2012-05-05 VITALS — BP 110/70 | HR 80 | Temp 100.5°F | Resp 16 | Wt 259.0 lb

## 2012-05-05 DIAGNOSIS — R0789 Other chest pain: Secondary | ICD-10-CM | POA: Insufficient documentation

## 2012-05-05 DIAGNOSIS — K219 Gastro-esophageal reflux disease without esophagitis: Secondary | ICD-10-CM

## 2012-05-05 DIAGNOSIS — M940 Chondrocostal junction syndrome [Tietze]: Secondary | ICD-10-CM | POA: Insufficient documentation

## 2012-05-05 DIAGNOSIS — G43909 Migraine, unspecified, not intractable, without status migrainosus: Secondary | ICD-10-CM | POA: Insufficient documentation

## 2012-05-05 MED ORDER — PROMETHAZINE HCL 12.5 MG PO TABS: 12.5000 mg | ORAL_TABLET | Freq: Four times a day (QID) | ORAL | Status: DC | PRN

## 2012-05-05 MED ORDER — HYDROCODONE-ACETAMINOPHEN 5-325 MG PO TABS: 1.0000 | ORAL_TABLET | Freq: Two times a day (BID) | ORAL | Status: DC | PRN

## 2012-05-05 MED ORDER — PRILOSEC OTC 20 MG PO TBEC: 20.0000 mg | DELAYED_RELEASE_TABLET | Freq: Every day | ORAL | Status: DC

## 2012-05-05 MED ORDER — SUMATRIPTAN SUCCINATE 100 MG PO TABS: 100.0000 mg | ORAL_TABLET | ORAL | Status: DC | PRN

## 2012-05-05 NOTE — Assessment & Plan Note (Signed)
Vicodin and promethazine prn  Potential benefits of a short term opioids use as well as potential risks (i.e. addiction risk, apnea etc) and complications (i.e. Somnolence, constipation and others) were explained to the patient and were aknowledged.

## 2012-05-05 NOTE — Assessment & Plan Note (Signed)
Loose wt Prilosec Rx

## 2012-05-05 NOTE — Progress Notes (Signed)
Subjective:    Patient ID: [name] [name], female    DOB: 10-02-1970, 41 y.o.   MRN: [id number]  HPI F/u ER visit for CP. C/o GERD, occ migraines Pt reports that she woke up on 2/12, and was strethching, when she started having left sided ,pressure like chest pain.  The chest pain was sharp, and now just pressure like, no radiation. The pain is worse with movement, no cough, not pleuritic and not exertional.  No hx of similar complain in the past .She has no medical hx, does have premature CAD in the family - mother at age 65s. No n/v/f/c/sob. No hx of PE, DVT and no risk factors for the same.  Patient is a 41 y.o. female presenting with chest pain. The history is provided by the patient.  Chest Pain  Pertinent negatives for primary symptoms include no shortness of breath, no abdominal pain, no nausea and no vomiting.  Past Medical History   Diagnosis  Date   .  ALLERGIC RHINITIS    .  CONSTIPATION    .  GERD    .  Headache     Past Surgical History   Procedure  Date   .  Dilation and curettage of uterus  1999   .  Cesarean section     Family History   Problem  Relation  Age of Onset   .  Hypertension  Mother    .  Stroke  Father     History   Substance Use Topics   .  Smoking status:  Never Smoker   .  Smokeless tobacco:  Not on file   .  Alcohol Use:  No      Comment: weekly    OB History    Grav  Para  Term  Preterm  Abortions  TAB  SAB  Ect  Mult  Living                  Allergies   Aspirin and Codeine  Home Medications    Current Outpatient Rx   Name   Route   Sig   Dispense   Refill   .  BC HEADACHE POWDER PO   Oral   Take 1 packet by mouth as needed. For pain       .  VICKS VAPORUB 4.7-1.2-2.6 % EX OINT   Apply externally   Apply 1 application topically as needed. Congestion       .  CETIRIZINE HCL 10 MG PO TABS   Oral   Take 10 mg by mouth as needed.       .  IBUPROFEN 400 MG PO TABS   Oral   Take 400 mg by mouth every 6 (six) hours as needed. As needed for  fever/pain        Pain not reproduced with palpation.     Review of Systems  Constitutional: Negative for chills, activity change, appetite change, fatigue and unexpected weight change.  HENT: Negative for congestion, mouth sores and sinus pressure.   Eyes: Negative for visual disturbance.  Respiratory: Negative for cough and chest tightness.   Gastrointestinal: Negative for nausea and abdominal pain.  Genitourinary: Negative for frequency, difficulty urinating and vaginal pain.  Musculoskeletal: Negative for back pain and gait problem.  Skin: Negative for pallor and rash.  Neurological: Negative for dizziness, tremors, weakness, numbness and headaches.  Psychiatric/Behavioral: Negative for suicidal ideas, confusion, sleep disturbance and dysphoric mood. The patient is not nervous/anxious.  Objective:   Physical Exam  Constitutional: She appears well-developed. No distress.       Obese  HENT:  Head: Normocephalic.  Right Ear: External ear normal.  Left Ear: External ear normal.  Nose: Nose normal.  Mouth/Throat: Oropharynx is clear and moist.  Eyes: Conjunctivae normal are normal. Pupils are equal, round, and reactive to light. Right eye exhibits no discharge. Left eye exhibits no discharge.  Neck: Normal range of motion. Neck supple. No JVD present. No tracheal deviation present. No thyromegaly present.  Cardiovascular: Normal rate, regular rhythm and normal heart sounds.   Pulmonary/Chest: No stridor. No respiratory distress. She has no wheezes.  Abdominal: Soft. Bowel sounds are normal. She exhibits no distension and no mass. There is no tenderness. There is no rebound and no guarding.  Musculoskeletal: She exhibits no edema and no tenderness.  Lymphadenopathy:    She has no cervical adenopathy.  Neurological: She displays normal reflexes. No cranial nerve deficit. She exhibits normal muscle tone. Coordination normal.  Skin: No rash noted. No erythema.  Psychiatric:  She has a normal mood and affect. Her behavior is normal. Judgment and thought content normal.          Assessment & Plan:

## 2012-05-05 NOTE — Assessment & Plan Note (Addendum)
Maxalt prn - $$$ Imitrex prn

## 2012-05-05 NOTE — Assessment & Plan Note (Signed)
Stretch See meds 

## 2012-05-19 ENCOUNTER — Other Ambulatory Visit: Payer: Self-pay | Admitting: *Deleted

## 2012-05-19 MED ORDER — IBUPROFEN 600 MG PO TABS: 600.0000 mg | ORAL_TABLET | Freq: Four times a day (QID) | ORAL | Status: DC | PRN

## 2012-06-12 ENCOUNTER — Encounter: Payer: Self-pay | Admitting: Internal Medicine

## 2012-06-12 ENCOUNTER — Ambulatory Visit (INDEPENDENT_AMBULATORY_CARE_PROVIDER_SITE_OTHER): Payer: BC Managed Care – PPO | Admitting: Internal Medicine

## 2012-06-12 VITALS — BP 128/98 | HR 92 | Temp 98.2°F | Ht 62.0 in | Wt 260.0 lb

## 2012-06-12 DIAGNOSIS — J019 Acute sinusitis, unspecified: Secondary | ICD-10-CM

## 2012-06-12 DIAGNOSIS — J029 Acute pharyngitis, unspecified: Secondary | ICD-10-CM

## 2012-06-12 MED ORDER — PREDNISONE 10 MG PO TABS: ORAL_TABLET | ORAL | Status: DC

## 2012-06-12 MED ORDER — AMOXICILLIN-POT CLAVULANATE 875-125 MG PO TABS: 1.0000 | ORAL_TABLET | Freq: Two times a day (BID) | ORAL | Status: DC

## 2012-06-12 NOTE — Patient Instructions (Signed)
Viral and Bacterial Pharyngitis Pharyngitis is soreness (inflammation) or infection of the pharynx. It is also called a sore throat. CAUSES   Most sore throats are caused by viruses and are part of a cold. However, some sore throats are caused by strep and other bacteria. Sore throats can also be caused by post nasal drip from draining sinuses, allergies and sometimes from sleeping with an open mouth. Infectious sore throats can be spread from person to person by coughing, sneezing and sharing cups or eating utensils. TREATMENT   Sore throats that are viral usually last 3-4 days. Viral illness will get better without medications (antibiotics). Strep throat and other bacterial infections will usually begin to get better about 24-48 hours after you begin to take antibiotics. HOME CARE INSTRUCTIONS    If the caregiver feels there is a bacterial infection or if there is a positive strep test, they will prescribe an antibiotic. The full course of antibiotics must be taken. If the full course of antibiotic is not taken, you or your child may become ill again. If you or your child has strep throat and do not finish all of the medication, serious heart or kidney diseases may develop.   Drink enough water and fluids to keep your urine clear or pale yellow.   Only take over-the-counter or prescription medicines for pain, discomfort or fever as directed by your caregiver.   Get lots of rest.   Gargle with salt water ( tsp. of salt in a glass of water) as often as every 1-2 hours as you need for comfort.   Hard candies may soothe the throat if individual is not at risk for choking. Throat sprays or lozenges may also be used.  SEEK MEDICAL CARE IF:    Large, tender lumps in the neck develop.   A rash develops.   Green, yellow-brown or bloody sputum is coughed up.   Your baby is older than 3 months with a rectal temperature of 100.5 F (38.1 C) or higher for more than 1 day.  SEEK IMMEDIATE MEDICAL  CARE IF:    A stiff neck develops.   You or your child are drooling or unable to swallow liquids.   You or your child are vomiting, unable to keep medications or liquids down.   You or your child has severe pain, unrelieved with recommended medications.   You or your child are having difficulty breathing (not due to stuffy nose).   You or your child are unable to fully open your mouth.   You or your child develop redness, swelling, or severe pain anywhere on the neck.   You have a fever.   Your baby is older than 3 months with a rectal temperature of 102 F (38.9 C) or higher.   Your baby is 33 months old or younger with a rectal temperature of 100.4 F (38 C) or higher.  MAKE SURE YOU:    Understand these instructions.   Will watch your condition.   Will get help right away if you are not doing well or get worse.  Document Released: 05/13/2005 Document Revised: 08/05/2011 Document Reviewed: 08/10/2007 [facility] Patient Information 2013 [place], [place].   Sinusitis Sinusitis is redness, soreness, and swelling (inflammation) of the paranasal sinuses. Paranasal sinuses are air pockets within the bones of your face (beneath the eyes, the middle of the forehead, or above the eyes). In healthy paranasal sinuses, mucus is able to drain out, and air is able to circulate through them by way  of your nose. However, when your paranasal sinuses are inflamed, mucus and air can become trapped. This can allow bacteria and other germs to grow and cause infection. Sinusitis can develop quickly and last only a short time (acute) or continue over a long period (chronic). Sinusitis that lasts for more than 12 weeks is considered chronic.   CAUSES   Causes of sinusitis include:  Allergies.   Structural abnormalities, such as displacement of the cartilage that separates your nostrils (deviated septum), which can decrease the air flow through your nose and sinuses and affect sinus drainage.    Functional abnormalities, such as when the small hairs (cilia) that line your sinuses and help remove mucus do not work properly or are not present.  SYMPTOMS   Symptoms of acute and chronic sinusitis are the same. The primary symptoms are pain and pressure around the affected sinuses. Other symptoms include:  Upper toothache.   Earache.   Headache.   Bad breath.   Decreased sense of smell and taste.   A cough, which worsens when you are lying flat.   Fatigue.   Fever.   Thick drainage from your nose, which often is green and may contain pus (purulent).   Swelling and warmth over the affected sinuses.  DIAGNOSIS   Your caregiver will perform a physical exam. During the exam, your caregiver may:  Look in your nose for signs of abnormal growths in your nostrils (nasal polyps).   Tap over the affected sinus to check for signs of infection.   View the inside of your sinuses (endoscopy) with a special imaging device with a light attached (endoscope), which is inserted into your sinuses.  If your caregiver suspects that you have chronic sinusitis, one or more of the following tests may be recommended:  Allergy tests.   Nasal culture A sample of mucus is taken from your nose and sent to a lab and screened for bacteria.   Nasal cytology A sample of mucus is taken from your nose and examined by your caregiver to determine if your sinusitis is related to an allergy.  TREATMENT   Most cases of acute sinusitis are related to a viral infection and will resolve on their own within 10 days. Sometimes medicines are prescribed to help relieve symptoms (pain medicine, decongestants, nasal steroid sprays, or saline sprays).   However, for sinusitis related to a bacterial infection, your caregiver will prescribe antibiotic medicines. These are medicines that will help kill the bacteria causing the infection.   Rarely, sinusitis is caused by a fungal infection. In theses cases, your caregiver  will prescribe antifungal medicine. For some cases of chronic sinusitis, surgery is needed. Generally, these are cases in which sinusitis recurs more than 3 times per year, despite other treatments. HOME CARE INSTRUCTIONS    Drink plenty of water. Water helps thin the mucus so your sinuses can drain more easily.   Use a humidifier.   Inhale steam 3 to 4 times a day (for example, sit in the bathroom with the shower running).   Apply a warm, moist washcloth to your face 3 to 4 times a day, or as directed by your caregiver.   Use saline nasal sprays to help moisten and clean your sinuses.   Take over-the-counter or prescription medicines for pain, discomfort, or fever only as directed by your caregiver.  SEEK IMMEDIATE MEDICAL CARE IF:  You have increasing pain or severe headaches.   You have nausea, vomiting, or drowsiness.   You  have swelling around your face.   You have vision problems.   You have a stiff neck.   You have difficulty breathing.  MAKE SURE YOU:    Understand these instructions.   Will watch your condition.   Will get help right away if you are not doing well or get worse.  Document Released: 05/13/2005 Document Revised: 08/05/2011 Document Reviewed: 05/28/2011 [facility] Patient Information 2013 [place], [place].

## 2012-06-12 NOTE — Progress Notes (Signed)
HPI  Pt presents to the clinic today with c/o nasal congestion, sinus tenderness, headache and sore throat. This started 5 days ago. She has taken Mucinex, flonase, and ibuprofen without relief. The symptoms are getting worse every day. She feels like her throat is really swollen. She does have a history of allergies and has had sinus infections in the past. She has had sick contacts.  Review of Systems    Past Medical History  Diagnosis Date  . ALLERGIC RHINITIS   . CONSTIPATION   . GERD   . Headache     Family History  Problem Relation Age of Onset  . Hypertension Mother   . Stroke Father     History   Social History  . Marital Status: Married    Spouse Name: N/A    Number of Children: N/A  . Years of Education: N/A   Occupational History  . Not on file.   Social History Main Topics  . Smoking status: Never Smoker   . Smokeless tobacco: Not on file  . Alcohol Use: No     Comment: weekly  . Drug Use: No  . Sexually Active: Yes   Other Topics Concern  . Not on file   Social History Narrative  . No narrative on file    Allergies  Allergen Reactions  . Aspirin Hives    Possible allergy to aspirin, tolerates some medications with aspirin in them   . Codeine Nausea And Vomiting     Constitutional: Positive headache, fatigue and fever. Denies abrupt weight changes.  HEENT:  Positive eye pain, pressure behind the eyes, facial pain, nasal congestion and sore throat. Denies eye redness, ear pain, ringing in the ears, wax buildup, runny nose or bloody nose. Respiratory: Positive cough. Denies difficulty breathing or shortness of breath.  Cardiovascular: Denies chest pain, chest tightness, palpitations or swelling in the hands or feet.   No other specific complaints in a complete review of systems (except as listed in HPI above).  Objective:    BP 128/98  Pulse 92  Temp 98.2 F (36.8 C) (Oral)  Ht 5\' 2"  (1.575 m)  Wt 260 lb (117.935 kg)  BMI 47.55 kg/m2   SpO2 99%  LMP 06/11/2012 Wt Readings from Last 3 Encounters:  06/12/12 260 lb (117.935 kg)  05/05/12 259 lb (117.482 kg)  09/16/11 253 lb (114.76 kg)    General: Appears her stated age, well developed, well nourished in NAD. HEENT: Head: normal shape and size; Eyes: sclera white, no icterus, conjunctiva pink, PERRLA and EOMs intact; Ears: Tm's gray and intact, normal light reflex; Nose: mucosa pink and moist, septum midline; Throat/Mouth: + PND. Teeth present, mucosa erythematous and moist, no exudate noted, no lesions or ulcerations noted, tonsils 3+.  Neck: Mild cervical lymphadenopathy. Neck supple, trachea midline. No massses, lumps or thyromegaly present.  Cardiovascular: Normal rate and rhythm. S1,S2 noted.  No murmur, rubs or gallops noted. No JVD or BLE edema. No carotid bruits noted. Pulmonary/Chest: Normal effort and positive vesicular breath sounds. No respiratory distress. No wheezes, rales or ronchi noted.      Assessment & Plan:   Acute bacterial sinusitis with pharyngitis, new onset with additional workup required:  Can use a Neti Pot which can be purchased from your local drug store. Flonase 2 sprays each nostril for 3 days and then as needed. Augmentin BID for 10 days Steroid taper  RTC as needed or if symptoms persist.

## 2012-07-17 ENCOUNTER — Ambulatory Visit (INDEPENDENT_AMBULATORY_CARE_PROVIDER_SITE_OTHER): Payer: BC Managed Care – PPO | Admitting: Internal Medicine

## 2012-07-17 ENCOUNTER — Encounter: Payer: Self-pay | Admitting: Internal Medicine

## 2012-07-17 VITALS — BP 128/90 | HR 80 | Temp 98.5°F | Resp 16 | Wt 269.0 lb

## 2012-07-17 DIAGNOSIS — M545 Low back pain, unspecified: Secondary | ICD-10-CM | POA: Insufficient documentation

## 2012-07-17 DIAGNOSIS — W009XXA Unspecified fall due to ice and snow, initial encounter: Secondary | ICD-10-CM | POA: Insufficient documentation

## 2012-07-17 DIAGNOSIS — W010XXA Fall on same level from slipping, tripping and stumbling without subsequent striking against object, initial encounter: Secondary | ICD-10-CM

## 2012-07-17 DIAGNOSIS — R1031 Right lower quadrant pain: Secondary | ICD-10-CM

## 2012-07-17 MED ORDER — KETOROLAC TROMETHAMINE 10 MG PO TABS: 10.0000 mg | ORAL_TABLET | Freq: Four times a day (QID) | ORAL | Status: DC | PRN

## 2012-07-17 MED ORDER — CYCLOBENZAPRINE HCL 5 MG PO TABS: 5.0000 mg | ORAL_TABLET | Freq: Three times a day (TID) | ORAL | Status: DC | PRN

## 2012-07-17 MED ORDER — IBUPROFEN 600 MG PO TABS: 600.0000 mg | ORAL_TABLET | Freq: Four times a day (QID) | ORAL | Status: DC | PRN

## 2012-07-17 NOTE — Assessment & Plan Note (Signed)
[facility] strain  xray

## 2012-07-17 NOTE — Progress Notes (Signed)
Subjective:    Fall The accident occurred 3 to 5 days ago. The fall occurred while walking. She landed on concrete. The point of impact was the right hip and buttocks. The pain is at a severity of 7/10. The pain is severe. The symptoms are aggravated by movement and ambulation. Pertinent negatives include no abdominal pain, headaches, nausea or numbness. She has tried ice and NSAID for the symptoms. The treatment provided mild relief.  Back Pain This is a new problem. The current episode started in the past 7 days. The quality of the pain is described as stabbing. The pain is at a severity of 7/10. The pain is severe. Associated symptoms include leg pain. Pertinent negatives include no abdominal pain, headaches, numbness or weakness.  Leg Pain  Pertinent negatives include no numbness.   F/u ER visit for CP. F/u GERD, occ migraines Pt reports that she woke up on 2/12, and was strethching, when she started having left sided ,pressure like chest pain.  The chest pain was sharp, and now just pressure like, no radiation. The pain is worse with movement, no cough, not pleuritic and not exertional.  No hx of similar complain in the past .She has no medical hx, does have premature CAD in the family - mother at age 21s. No n/v/f/c/sob. No hx of PE, DVT and no risk factors for the same.  Patient is a 42 y.o. female presenting with chest pain. The history is provided by the patient.  Chest Pain  Pertinent negatives for primary symptoms include no shortness of breath, no abdominal pain, no nausea and no vomiting.  Past Medical History   Diagnosis  Date   .  ALLERGIC RHINITIS    .  CONSTIPATION    .  GERD    .  Headache     Past Surgical History   Procedure  Date   .  Dilation and curettage of uterus  1999   .  Cesarean section     Family History   Problem  Relation  Age of Onset   .  Hypertension  Mother    .  Stroke  Father     History   Substance Use Topics   .  Smoking status:  Never  Smoker   .  Smokeless tobacco:  Not on file   .  Alcohol Use:  No      Comment: weekly    OB History    Grav  Para  Term  Preterm  Abortions  TAB  SAB  Ect  Mult  Living                  Allergies   Aspirin and Codeine  Home Medications    Current Outpatient Rx   Name   Route   Sig   Dispense   Refill   .  BC HEADACHE POWDER PO   Oral   Take 1 packet by mouth as needed. For pain       .  VICKS VAPORUB 4.7-1.2-2.6 % EX OINT   Apply externally   Apply 1 application topically as needed. Congestion       .  CETIRIZINE HCL 10 MG PO TABS   Oral   Take 10 mg by mouth as needed.       .  IBUPROFEN 400 MG PO TABS   Oral   Take 400 mg by mouth every 6 (six) hours as needed. As needed for fever/pain  Pain not reproduced with palpation.     Review of Systems  Constitutional: Negative for chills, activity change, appetite change, fatigue and unexpected weight change.  HENT: Negative for congestion, mouth sores and sinus pressure.   Eyes: Negative for visual disturbance.  Respiratory: Negative for cough and chest tightness.   Gastrointestinal: Negative for nausea and abdominal pain.  Genitourinary: Negative for frequency, difficulty urinating and vaginal pain.  Musculoskeletal: Positive for back pain. Negative for gait problem.  Skin: Negative for pallor and rash.  Neurological: Negative for dizziness, tremors, weakness, numbness and headaches.  Psychiatric/Behavioral: Negative for suicidal ideas, confusion, sleep disturbance and dysphoric mood. The patient is not nervous/anxious.        Objective:   Physical Exam  Constitutional: She appears well-developed. No distress.  Obese  HENT:  Head: Normocephalic.  Right Ear: External ear normal.  Left Ear: External ear normal.  Nose: Nose normal.  Mouth/Throat: Oropharynx is clear and moist.  Eyes: Conjunctivae are normal. Pupils are equal, round, and reactive to light. Right eye exhibits no discharge. Left eye exhibits no  discharge.  Neck: Normal range of motion. Neck supple. No JVD present. No tracheal deviation present. No thyromegaly present.  Cardiovascular: Normal rate, regular rhythm and normal heart sounds.   Pulmonary/Chest: No stridor. No respiratory distress. She has no wheezes.  Abdominal: Soft. Bowel sounds are normal. She exhibits no distension and no mass. There is no tenderness. There is no rebound and no guarding.  Musculoskeletal: She exhibits no edema and no tenderness.  Lymphadenopathy:    She has no cervical adenopathy.  Neurological: She displays normal reflexes. No cranial nerve deficit. She exhibits normal muscle tone. Coordination normal.  Skin: No rash noted. No erythema.  Psychiatric: She has a normal mood and affect. Her behavior is normal. Judgment and thought content normal.          Assessment & Plan:

## 2012-11-02 ENCOUNTER — Ambulatory Visit (INDEPENDENT_AMBULATORY_CARE_PROVIDER_SITE_OTHER): Payer: BC Managed Care – PPO | Admitting: Internal Medicine

## 2012-11-02 ENCOUNTER — Encounter: Payer: Self-pay | Admitting: Internal Medicine

## 2012-11-02 DIAGNOSIS — R635 Abnormal weight gain: Secondary | ICD-10-CM

## 2012-11-02 MED ORDER — LORCASERIN HCL 10 MG PO TABS: 1.0000 | ORAL_TABLET | Freq: Two times a day (BID) | ORAL | Status: DC

## 2012-11-02 NOTE — Assessment & Plan Note (Signed)
Wt watchers Belviq trial

## 2012-11-02 NOTE — Assessment & Plan Note (Signed)
Discussed diet  

## 2012-11-02 NOTE — Progress Notes (Signed)
  Subjective:    HPI  C/o wt gain   Review of Systems  Constitutional: Positive for unexpected weight change. Negative for appetite change and fatigue.  Respiratory: Negative for shortness of breath and wheezing.     Wt Readings from Last 3 Encounters:  11/02/12 263 lb (119.296 kg)  07/17/12 269 lb (122.018 kg)  06/12/12 260 lb (117.935 kg)   BP Readings from Last 3 Encounters:  11/02/12 128/78  07/17/12 128/90  06/12/12 128/98        Objective:   Physical Exam  Constitutional: She appears well-developed. No distress.  Obese  HENT:  Head: Normocephalic.  Right Ear: External ear normal.  Left Ear: External ear normal.  Nose: Nose normal.  Mouth/Throat: Oropharynx is clear and moist.  Eyes: Conjunctivae are normal. Pupils are equal, round, and reactive to light. Right eye exhibits no discharge. Left eye exhibits no discharge.  Neck: Normal range of motion. Neck supple. No JVD present. No tracheal deviation present. No thyromegaly present.  Cardiovascular: Normal rate, regular rhythm and normal heart sounds.   Pulmonary/Chest: No stridor. No respiratory distress. She has no wheezes.  Abdominal: Soft. Bowel sounds are normal. She exhibits no distension and no mass. There is no tenderness. There is no rebound and no guarding.  Musculoskeletal: She exhibits no edema and no tenderness.  Lymphadenopathy:    She has no cervical adenopathy.  Neurological: She displays normal reflexes. No cranial nerve deficit. She exhibits normal muscle tone. Coordination normal.  Skin: No rash noted. No erythema.  Psychiatric: She has a normal mood and affect. Her behavior is normal. Judgment and thought content normal.          Assessment & Plan:

## 2012-11-04 ENCOUNTER — Telehealth: Payer: Self-pay | Admitting: Internal Medicine

## 2012-11-04 NOTE — Telephone Encounter (Signed)
Pt got an rx for Belviq that is too expensive.  She states the pharmacy ([facility] on [address]) faxed a form for a PA.  Please call when it is finished.

## 2012-11-12 NOTE — Telephone Encounter (Signed)
Received PA form

## 2012-11-12 NOTE — Telephone Encounter (Addendum)
[employer] is faxing PA form to office. Case# [id number]

## 2012-11-13 NOTE — Telephone Encounter (Signed)
Completed PA form faxed

## 2012-11-17 ENCOUNTER — Telehealth: Payer: Self-pay | Admitting: *Deleted

## 2012-11-17 NOTE — Telephone Encounter (Signed)
Pt called requesting PA status on Belviq.  I spoke with pt informed her that PA form was completed and returned to insurance company on 6.20.2014.

## 2012-12-02 ENCOUNTER — Telehealth: Payer: Self-pay | Admitting: *Deleted

## 2012-12-02 NOTE — Telephone Encounter (Signed)
I called [employer] to check PA status. The request was denied. Pt informed. She wants to know if there is anything else she can try that may be covered. Please advise.

## 2012-12-02 NOTE — Telephone Encounter (Signed)
Pt called requesting status of PA on Belviq from 6.20.2014.  There is no record of PA being approved.  Please advise

## 2012-12-02 NOTE — Telephone Encounter (Signed)
Nothing is covered. Phentermine is inexpensive: approved by FDA to be used short term Thx

## 2012-12-03 NOTE — Telephone Encounter (Signed)
Pt is requesting an Rx for Phentermine.  She will be here today to pick up medications for her husband this afternoon.

## 2012-12-04 MED ORDER — PHENTERMINE HCL 37.5 MG PO TABS: 37.5000 mg | ORAL_TABLET | Freq: Every day | ORAL | Status: DC

## 2012-12-04 NOTE — Telephone Encounter (Signed)
OK RTC 2 mo Thx

## 2012-12-04 NOTE — Telephone Encounter (Signed)
Left detailed mess informing pt Rx is at [facility] and to schedule 2 mo f/u.

## 2012-12-10 ENCOUNTER — Telehealth: Payer: Self-pay | Admitting: *Deleted

## 2012-12-10 NOTE — Telephone Encounter (Signed)
Left msg on triage stating received letter from [facility] needing md to call & give necessary information on Belviq that was rx. Pls call [phone]. Case ID [id number]...lmb

## 2012-12-28 ENCOUNTER — Ambulatory Visit (INDEPENDENT_AMBULATORY_CARE_PROVIDER_SITE_OTHER): Payer: BC Managed Care – PPO | Admitting: Internal Medicine

## 2012-12-28 ENCOUNTER — Encounter: Payer: Self-pay | Admitting: Internal Medicine

## 2012-12-28 VITALS — BP 130/84 | HR 76 | Temp 98.2°F | Resp 16 | Wt 266.0 lb

## 2012-12-28 DIAGNOSIS — R0789 Other chest pain: Secondary | ICD-10-CM

## 2012-12-28 DIAGNOSIS — R079 Chest pain, unspecified: Secondary | ICD-10-CM

## 2012-12-28 DIAGNOSIS — M940 Chondrocostal junction syndrome [Tietze]: Secondary | ICD-10-CM

## 2012-12-28 MED ORDER — MELOXICAM 15 MG PO TABS: 15.0000 mg | ORAL_TABLET | Freq: Every day | ORAL | Status: DC | PRN

## 2012-12-28 MED ORDER — PROMETHAZINE HCL 12.5 MG PO TABS: 12.5000 mg | ORAL_TABLET | Freq: Four times a day (QID) | ORAL | Status: DC | PRN

## 2012-12-28 MED ORDER — HYDROCODONE-ACETAMINOPHEN 5-325 MG PO TABS: 1.0000 | ORAL_TABLET | Freq: Two times a day (BID) | ORAL | Status: DC | PRN

## 2012-12-28 MED ORDER — ESOMEPRAZOLE MAGNESIUM 40 MG PO CPDR: 40.0000 mg | DELAYED_RELEASE_CAPSULE | Freq: Every day | ORAL | Status: DC

## 2012-12-28 NOTE — Assessment & Plan Note (Signed)
Meloxicam Norco prn EKG was ok To work on 8/7 if ok

## 2012-12-28 NOTE — Progress Notes (Signed)
   Subjective:    Chest Pain  This is a new problem. The current episode started in the past 7 days (3 d). The onset quality is gradual. The problem occurs constantly. The pain is present in the lateral region (under and along L sternum). The pain is severe (at times). The quality of the pain is described as stabbing and dull. Associated symptoms include shortness of breath. Pertinent negatives include no abdominal pain, cough, fever, syncope or weakness. Treatments tried: ASA. The treatment provided no relief.  Shortness of Breath This is a new problem. Associated symptoms include chest pain. Pertinent negatives include no abdominal pain, fever, syncope or wheezing.   She had costochondritis in December   Review of Systems  Constitutional: Positive for unexpected weight change. Negative for fever, appetite change and fatigue.  Respiratory: Positive for shortness of breath. Negative for cough and wheezing.   Cardiovascular: Positive for chest pain. Negative for syncope.  Gastrointestinal: Negative for abdominal pain.  Neurological: Negative for weakness.    Wt Readings from Last 3 Encounters:  12/28/12 266 lb (120.657 kg)  11/02/12 263 lb (119.296 kg)  07/17/12 269 lb (122.018 kg)   BP Readings from Last 3 Encounters:  12/28/12 130/84  11/02/12 128/78  07/17/12 128/90        Objective:   Physical Exam  Constitutional: She appears well-developed. No distress.  Obese  HENT:  Head: Normocephalic.  Right Ear: External ear normal.  Left Ear: External ear normal.  Nose: Nose normal.  Mouth/Throat: Oropharynx is clear and moist.  Eyes: Conjunctivae are normal. Pupils are equal, round, and reactive to light. Right eye exhibits no discharge. Left eye exhibits no discharge.  Neck: Normal range of motion. Neck supple. No JVD present. No tracheal deviation present. No thyromegaly present.  Cardiovascular: Normal rate, regular rhythm and normal heart sounds.   Pulmonary/Chest: No  stridor. No respiratory distress. She has no wheezes.  Abdominal: Soft. Bowel sounds are normal. She exhibits no distension and no mass. There is no tenderness. There is no rebound and no guarding.  Musculoskeletal: She exhibits no edema and no tenderness.  Lymphadenopathy:    She has no cervical adenopathy.  Neurological: She displays normal reflexes. No cranial nerve deficit. She exhibits normal muscle tone. Coordination normal.  Skin: No rash noted. No erythema.  Psychiatric: She has a normal mood and affect. Her behavior is normal. Judgment and thought content normal.          Assessment & Plan:

## 2012-12-28 NOTE — Assessment & Plan Note (Signed)
8/14 relapsed L

## 2012-12-28 NOTE — Patient Instructions (Addendum)
Go to ER if bad 

## 2013-04-01 ENCOUNTER — Other Ambulatory Visit: Payer: Self-pay

## 2013-04-06 ENCOUNTER — Ambulatory Visit (INDEPENDENT_AMBULATORY_CARE_PROVIDER_SITE_OTHER): Payer: BC Managed Care – PPO | Admitting: Internal Medicine

## 2013-04-06 ENCOUNTER — Encounter: Payer: Self-pay | Admitting: Internal Medicine

## 2013-04-06 VITALS — BP 130/94 | HR 81 | Temp 99.2°F | Wt 273.0 lb

## 2013-04-06 DIAGNOSIS — S0340XA Sprain of jaw, unspecified side, initial encounter: Secondary | ICD-10-CM | POA: Insufficient documentation

## 2013-04-06 DIAGNOSIS — H6122 Impacted cerumen, left ear: Secondary | ICD-10-CM | POA: Insufficient documentation

## 2013-04-06 DIAGNOSIS — G43909 Migraine, unspecified, not intractable, without status migrainosus: Secondary | ICD-10-CM

## 2013-04-06 DIAGNOSIS — H6120 Impacted cerumen, unspecified ear: Secondary | ICD-10-CM

## 2013-04-06 MED ORDER — IBUPROFEN 600 MG PO TABS: 600.0000 mg | ORAL_TABLET | Freq: Two times a day (BID) | ORAL | Status: DC

## 2013-04-06 MED ORDER — SUMATRIPTAN SUCCINATE 100 MG PO TABS: 100.0000 mg | ORAL_TABLET | ORAL | Status: DC | PRN

## 2013-04-06 NOTE — Assessment & Plan Note (Signed)
She will irrigate at home 

## 2013-04-06 NOTE — Progress Notes (Signed)
   Subjective:    HPI C/o R TMJ severe pain after yawning - it is popping now all the time F/u migraines   Review of Systems  Constitutional: Positive for unexpected weight change. Negative for appetite change and fatigue.    Wt Readings from Last 3 Encounters:  04/06/13 273 lb (123.832 kg)  12/28/12 266 lb (120.657 kg)  11/02/12 263 lb (119.296 kg)   BP Readings from Last 3 Encounters:  04/06/13 130/94  12/28/12 130/84  11/02/12 128/78        Objective:   Physical Exam  Constitutional: She appears well-developed. No distress.  Obese  HENT:  Head: Normocephalic.  Right Ear: External ear normal.  Left Ear: External ear normal.  Nose: Nose normal.  Mouth/Throat: Oropharynx is clear and moist.  Eyes: Conjunctivae are normal. Pupils are equal, round, and reactive to light. Right eye exhibits no discharge. Left eye exhibits no discharge.  Neck: Normal range of motion. Neck supple. No JVD present. No tracheal deviation present. No thyromegaly present.  Cardiovascular: Normal rate, regular rhythm and normal heart sounds.   Pulmonary/Chest: No stridor. No respiratory distress. She has no wheezes.  Abdominal: Soft. Bowel sounds are normal. She exhibits no distension and no mass. There is no tenderness. There is no rebound and no guarding.  Musculoskeletal: She exhibits no edema and no tenderness.  Lymphadenopathy:    She has no cervical adenopathy.  Neurological: She displays normal reflexes. No cranial nerve deficit. She exhibits normal muscle tone. Coordination normal.  Skin: No rash noted. No erythema.  Psychiatric: She has a normal mood and affect. Her behavior is normal. Judgment and thought content normal.  L TMJ is tender, popped w/wide opening Wax in L ear       Assessment & Plan:

## 2013-04-06 NOTE — Patient Instructions (Signed)
Soft diet Cut up burgers etc

## 2013-04-06 NOTE — Assessment & Plan Note (Signed)
Continue with current Imitrex prn prescription therapy as reflected on the Med list.

## 2013-04-06 NOTE — Assessment & Plan Note (Signed)
See instructions See meds

## 2013-04-06 NOTE — Progress Notes (Signed)
Pre visit review using our clinic review tool, if applicable. No additional management support is needed unless otherwise documented below in the visit note. 

## 2013-06-30 NOTE — H&P (Signed)
Laverda SorensonMelissa R Singleterry  DICTATION # [id number] CSN# [id number]   Meriel PicaHOLLAND,Reyanne Hussar M, MD 06/30/2013 3:36 PM

## 2013-07-07 ENCOUNTER — Other Ambulatory Visit: Payer: Self-pay | Admitting: Internal Medicine

## 2013-07-13 MED ORDER — DEXTROSE 5 % IV SOLN
2.0000 g | INTRAVENOUS | Status: AC
  Filled 2013-07-13: qty 2

## 2013-07-14 ENCOUNTER — Encounter (HOSPITAL_COMMUNITY)
Admission: RE | Disposition: A | Discharge status: Home / Self Care | Payer: Self-pay | Source: Ambulatory Visit | Attending: Obstetrics and Gynecology

## 2013-07-14 ENCOUNTER — Ambulatory Visit (HOSPITAL_COMMUNITY): Payer: BC Managed Care – PPO | Admitting: Anesthesiology

## 2013-07-14 ENCOUNTER — Ambulatory Visit (HOSPITAL_COMMUNITY)
Admission: RE | Admit: 2013-07-14 | Discharge: 2013-07-14 | Disposition: A | Discharge status: Home / Self Care | Payer: BC Managed Care – PPO | Source: Ambulatory Visit | Attending: Obstetrics and Gynecology | Admitting: Obstetrics and Gynecology

## 2013-07-14 ENCOUNTER — Encounter (HOSPITAL_COMMUNITY): Payer: Self-pay | Admitting: Anesthesiology

## 2013-07-14 ENCOUNTER — Encounter (HOSPITAL_COMMUNITY): Payer: BC Managed Care – PPO | Admitting: Anesthesiology

## 2013-07-14 DIAGNOSIS — N946 Dysmenorrhea, unspecified: Secondary | ICD-10-CM | POA: Insufficient documentation

## 2013-07-14 DIAGNOSIS — N840 Polyp of corpus uteri: Secondary | ICD-10-CM | POA: Insufficient documentation

## 2013-07-14 DIAGNOSIS — D649 Anemia, unspecified: Secondary | ICD-10-CM | POA: Insufficient documentation

## 2013-07-14 DIAGNOSIS — K219 Gastro-esophageal reflux disease without esophagitis: Secondary | ICD-10-CM | POA: Insufficient documentation

## 2013-07-14 DIAGNOSIS — N920 Excessive and frequent menstruation with regular cycle: Secondary | ICD-10-CM | POA: Insufficient documentation

## 2013-07-14 HISTORY — PX: DILATION AND CURETTAGE OF UTERUS: SHX78

## 2013-07-14 LAB — PREGNANCY, URINE: Preg Test, Ur: NEGATIVE

## 2013-07-14 LAB — CBC
HEMATOCRIT: 30.2 % — AB (ref 36.0–46.0)
Hemoglobin: 10.4 g/dL — ABNORMAL LOW (ref 12.0–15.0)
MCH: 28.1 pg (ref 26.0–34.0)
MCHC: 34.4 g/dL (ref 30.0–36.0)
MCV: 81.6 fL (ref 78.0–100.0)
Platelets: 409 10*3/uL — ABNORMAL HIGH (ref 150–400)
RBC: 3.7 MIL/uL — ABNORMAL LOW (ref 3.87–5.11)
RDW: 14.6 % (ref 11.5–15.5)
WBC: 8.7 10*3/uL (ref 4.0–10.5)

## 2013-07-14 SURGERY — DILATION AND CURETTAGE
Anesthesia: General | Site: Uterus

## 2013-07-14 MED ORDER — FENTANYL CITRATE 0.05 MG/ML IJ SOLN
INTRAMUSCULAR | Status: AC
  Filled 2013-07-14: qty 2

## 2013-07-14 MED ORDER — PROPOFOL 10 MG/ML IV BOLUS
INTRAVENOUS | Status: DC | PRN
  Administered 2013-07-14: 180 mg via INTRAVENOUS

## 2013-07-14 MED ORDER — FENTANYL CITRATE 0.05 MG/ML IJ SOLN
INTRAMUSCULAR | Status: AC
  Administered 2013-07-14: 50 ug via INTRAVENOUS
  Filled 2013-07-14: qty 2

## 2013-07-14 MED ORDER — PROPOFOL 10 MG/ML IV EMUL
INTRAVENOUS | Status: AC
  Filled 2013-07-14: qty 20

## 2013-07-14 MED ORDER — ONDANSETRON HCL 4 MG/2ML IJ SOLN
INTRAMUSCULAR | Status: AC
  Filled 2013-07-14: qty 2

## 2013-07-14 MED ORDER — MEPERIDINE HCL 25 MG/ML IJ SOLN: 6.2500 mg | INTRAMUSCULAR | Status: DC | PRN

## 2013-07-14 MED ORDER — FENTANYL CITRATE 0.05 MG/ML IJ SOLN
25.0000 ug | INTRAMUSCULAR | Status: DC | PRN
  Administered 2013-07-14: 50 ug via INTRAVENOUS

## 2013-07-14 MED ORDER — MIDAZOLAM HCL 2 MG/2ML IJ SOLN
INTRAMUSCULAR | Status: AC
  Filled 2013-07-14: qty 2

## 2013-07-14 MED ORDER — LIDOCAINE HCL 1 % IJ SOLN
INTRAMUSCULAR | Status: DC | PRN
  Administered 2013-07-14: 10 mL

## 2013-07-14 MED ORDER — FENTANYL CITRATE 0.05 MG/ML IJ SOLN
INTRAMUSCULAR | Status: DC | PRN
  Administered 2013-07-14 (×2): 50 ug via INTRAVENOUS

## 2013-07-14 MED ORDER — METOCLOPRAMIDE HCL 5 MG/ML IJ SOLN: 10.0000 mg | Freq: Once | INTRAMUSCULAR | Status: DC | PRN

## 2013-07-14 MED ORDER — LIDOCAINE HCL (CARDIAC) 20 MG/ML IV SOLN
INTRAVENOUS | Status: DC | PRN
  Administered 2013-07-14: 70 mg via INTRAVENOUS
  Administered 2013-07-14: 30 mg via INTRAVENOUS

## 2013-07-14 MED ORDER — ONDANSETRON HCL 4 MG/2ML IJ SOLN
INTRAMUSCULAR | Status: DC | PRN
  Administered 2013-07-14: 4 mg via INTRAVENOUS

## 2013-07-14 MED ORDER — DEXTROSE 5 % IV SOLN
2.0000 g | INTRAVENOUS | Status: DC | PRN
  Administered 2013-07-14: 2 g via INTRAVENOUS

## 2013-07-14 MED ORDER — HYDROCODONE-IBUPROFEN 7.5-200 MG PO TABS: 1.0000 | ORAL_TABLET | Freq: Three times a day (TID) | ORAL | Status: DC | PRN

## 2013-07-14 MED ORDER — LIDOCAINE HCL (CARDIAC) 20 MG/ML IV SOLN
INTRAVENOUS | Status: AC
  Filled 2013-07-14: qty 5

## 2013-07-14 MED ORDER — LACTATED RINGERS IV SOLN
INTRAVENOUS | Status: DC
  Administered 2013-07-14 (×2): via INTRAVENOUS

## 2013-07-14 MED ORDER — MIDAZOLAM HCL 2 MG/2ML IJ SOLN
INTRAMUSCULAR | Status: DC | PRN
  Administered 2013-07-14: 2 mg via INTRAVENOUS

## 2013-07-14 MED ORDER — LIDOCAINE HCL 1 % IJ SOLN
INTRAMUSCULAR | Status: AC
  Filled 2013-07-14: qty 20

## 2013-07-14 MED ORDER — KETOROLAC TROMETHAMINE 30 MG/ML IJ SOLN
INTRAMUSCULAR | Status: AC
  Filled 2013-07-14: qty 1

## 2013-07-14 MED ORDER — KETOROLAC TROMETHAMINE 30 MG/ML IJ SOLN
INTRAMUSCULAR | Status: DC | PRN
  Administered 2013-07-14: 30 mg via INTRAVENOUS

## 2013-07-14 SURGICAL SUPPLY — 23 items
CANISTER SUCT 3000ML (MISCELLANEOUS) ×4 IMPLANT
CATH ROBINSON RED A/P 16FR (CATHETERS) ×4 IMPLANT
CLOTH BEACON ORANGE TIMEOUT ST (SAFETY) ×4 IMPLANT
CONTAINER PREFILL 10% NBF 60ML (FORM) ×8 IMPLANT
DRAPE HYSTEROSCOPY (DRAPE) ×4 IMPLANT
DRSG TELFA 3X8 NADH (GAUZE/BANDAGES/DRESSINGS) ×4 IMPLANT
ELECT REM PT RETURN 9FT ADLT (ELECTROSURGICAL) ×4
ELECTRODE REM PT RTRN 9FT ADLT (ELECTROSURGICAL) ×1 IMPLANT
GLOVE BIO SURGEON STRL SZ7 (GLOVE) ×4 IMPLANT
GLOVE BIOGEL PI IND STRL 7.0 (GLOVE) ×5 IMPLANT
GLOVE BIOGEL PI INDICATOR 7.0 (GLOVE) ×10
GLOVE ECLIPSE 7.0 STRL STRAW (GLOVE) ×12 IMPLANT
GLOVE SURG SS PI 7.0 STRL IVOR (GLOVE) ×15 IMPLANT
GOWN STRL REUS W/TWL LRG LVL3 (GOWN DISPOSABLE) ×8 IMPLANT
NDL SPNL 22GX3.5 QUINCKE BK (NEEDLE) ×1 IMPLANT
NEEDLE SPNL 22GX3.5 QUINCKE BK (NEEDLE) ×4 IMPLANT
PACK VAGINAL MINOR WOMEN LF (CUSTOM PROCEDURE TRAY) ×4 IMPLANT
PAD OB MATERNITY 4.3X12.25 (PERSONAL CARE ITEMS) ×4 IMPLANT
SET TUBING HYSTEROSCOPY 2 NDL (TUBING) ×3 IMPLANT
SYR CONTROL 10ML LL (SYRINGE) ×4 IMPLANT
TOWEL OR 17X24 6PK STRL BLUE (TOWEL DISPOSABLE) ×8 IMPLANT
TUBE HYSTEROSCOPY W Y-CONNECT (TUBING) ×3 IMPLANT
WATER STERILE IRR 1000ML POUR (IV SOLUTION) ×4 IMPLANT

## 2013-07-14 NOTE — Discharge Instructions (Signed)
DISCHARGE INSTRUCTIONS: D&C  The following instructions have been prepared to help you care for yourself upon your return home.  MAY TAKE IBUPROFEN (MOTRIN, ADVIL) OR ALEVE AFTER 7:30 PM FOR CRAMPS!!!   Personal hygiene:  Use sanitary pads for vaginal drainage, not tampons.  Shower the day after your procedure.  NO tub baths, pools or Jacuzzis for 2-3 weeks.  Wipe front to back after using the bathroom.  Activity and limitations:  Do NOT drive or operate any equipment for 24 hours. The effects of anesthesia are still present and drowsiness may result.  Do NOT rest in bed all day.  Walking is encouraged.  Walk up and down stairs slowly.  You may resume your normal activity in one to two days or as indicated by your physician.  Sexual activity: NO intercourse for at least 2 weeks after the procedure, or as indicated by your physician.  Diet: Eat a light meal as desired this evening. You may resume your usual diet tomorrow.  Return to work: You may resume your work activities in one to two days or as indicated by your doctor.  What to expect after your surgery: Expect to have vaginal bleeding/discharge for 2-3 days and spotting for up to 10 days. It is not unusual to have soreness for up to 1-2 weeks. You may have a slight burning sensation when you urinate for the first day. Mild cramps may continue for a couple of days. You may have a regular period in 2-6 weeks.  Call your doctor for any of the following:  Excessive vaginal bleeding, saturating and changing one pad every hour.  Inability to urinate 6 hours after discharge from hospital.  Pain not relieved by pain medication.  Fever of 100.4 F or greater.  Unusual vaginal discharge or odor.   Call for an appointment:    Patients signature: ______________________  Nurses signature ________________________  Support person's signature_______________________

## 2013-07-14 NOTE — Anesthesia Preprocedure Evaluation (Addendum)
Anesthesia Evaluation  Patient identified by MRN, date of birth, ID band Patient awake    Reviewed: Allergy & Precautions, H&P , NPO status   Airway Mallampati: III TM Distance: >3 FB Neck ROM: Full    Dental no notable dental hx. (+) Teeth Intact   Pulmonary neg pulmonary ROS,  breath sounds clear to auscultation  Pulmonary exam normal       Cardiovascular negative cardio ROS  Rhythm:Regular Rate:Normal     Neuro/Psych  Headaches, negative psych ROS   GI/Hepatic Neg liver ROS, GERD-  Medicated and Controlled,  Endo/Other  Morbid obesity  Renal/GU negative Renal ROS  negative genitourinary   Musculoskeletal negative musculoskeletal ROS (+)   Abdominal (+) + obese,   Peds  Hematology  (+) anemia ,   Anesthesia Other Findings   Reproductive/Obstetrics Menorrhagia Endometrial polyp                          Anesthesia Physical Anesthesia Plan  ASA: III  Anesthesia Plan: General   Post-op Pain Management:    Induction: Intravenous  Airway Management Planned: LMA  Additional Equipment:   Intra-op Plan:   Post-operative Plan: Extubation in OR  Informed Consent: I have reviewed the patients History and Physical, chart, labs and discussed the procedure including the risks, benefits and alternatives for the proposed anesthesia with the patient or authorized representative who has indicated his/her understanding and acceptance.   Dental advisory given  Plan Discussed with: Anesthesiologist, CRNA and Surgeon  Anesthesia Plan Comments:         Anesthesia Quick Evaluation

## 2013-07-14 NOTE — Progress Notes (Signed)
The patient was re-examined with no change in status 

## 2013-07-14 NOTE — Transfer of Care (Signed)
Immediate Anesthesia Transfer of Care Note  Patient: [name] [name]  Procedure(s) Performed: Procedure(s): DILATATION & CURETTAGE/HYSTEROSCOPY WITH TRUCLEAR (N/A)  Patient Location: PACU  Anesthesia Type:General  Level of Consciousness: awake, alert , oriented and patient cooperative  Airway & Oxygen Therapy: Patient Spontanous Breathing and Patient connected to nasal cannula oxygen  Post-op Assessment: Report given to PACU RN and Post -op Vital signs reviewed and stable  Post vital signs: Reviewed and stable  Complications: No apparent anesthesia complications

## 2013-07-14 NOTE — Op Note (Signed)
Preoperative diagnosis: Menorrhagia with anemia  Postoperative diagnosis: Same  Procedure: D&C hysteroscopy  Surgeon: Marcelle OverlieHolland  Anesthesia: Gen. plus paracervical block  Specimens removed: Endometrial curettings, to pathology  Suture and findings:  The patient was taken the operating room after an adequate level of general anesthesia was obtained appropriate timeout for taken. The perineum was prepped and draped along the vagina the bladder was drained EUA was carried out uterus was 8 weeks size mid position adnexa negative. Speculum was positioned paracervical block was then created by infiltrating at 3 and 9:00 submucosally 5-7 cc of 1% Xylocaine at 3 and 9:00 after negative aspiration. There was already some moderate bleeding significant dilatation was not required continuous flow hysteroscope was inserted adequate visualization but there was significant endometrial buildup. The scope was removed, circumferential sharp curettage carried out tissue sent to pathology. The scope was reinserted the cavity was irrigated no other significant residual anatomic defects specifically no polyps nor fibroids noted in an otherwise normal A-shaped cavity. There was minimal bleeding, she tolerated this well went to recovery room in good condition.  Dictated with [facility]  [name] M. Milana ObeyHolland M.D.

## 2013-07-14 NOTE — Anesthesia Postprocedure Evaluation (Signed)
  Anesthesia Post-op Note  Patient: [name] [name]  Procedure(s) Performed: Procedure(s): DILATATION AND CURETTAGE (N/A)  Patient Location: PACU  Anesthesia Type:General  Level of Consciousness: awake, alert  and oriented  Airway and Oxygen Therapy: Patient Spontanous Breathing  Post-op Pain: mild  Post-op Assessment: Post-op Vital signs reviewed, Patient's Cardiovascular Status Stable, Respiratory Function Stable, Patent Airway, No signs of Nausea or vomiting and Pain level controlled  Post-op Vital Signs: Reviewed and stable  Complications: No apparent anesthesia complications

## 2013-07-15 ENCOUNTER — Encounter (HOSPITAL_COMMUNITY): Payer: Self-pay | Admitting: Obstetrics and Gynecology

## 2013-07-15 NOTE — H&P (Signed)
NAMMeriel [name]:  [name] [name], [name] [name]              ACCOUNT NO.:  [id number]  MEDICAL RECORD NO.:  [id number]  LOCATION:                                 FACILITY:  PHYSICIAN:  Duke Salviaichard M. Marcelle OverlieHolland, M.D.DATE OF BIRTH:  [birth year]  DATE OF ADMISSION:  07/14/2013 DATE OF DISCHARGE:                             HISTORY & PHYSICAL   CHIEF COMPLAINT:  Menorrhagia with anemia, dysmenorrhea.  HISTORY OF PRESENT ILLNESS:  A 43 year old, G2, P1, using contraceptive gel for birth control.  When she was seen last fall was complaining of heavy bleeding.  Her hemoglobin at that time was 13.7.  FHT was done in the office April 14, 2013, that showed possible adenomyosis, a small 12-mm fibroid and what appeared to be synechiae versus a possible endometrial polyp.  We have recommended D and C, hysteroscopy at that time, when she wanted to postpone until March of 2015, but due to some increased problems with bleeding, was seen early February for followup evaluation.  Hemoglobin at that time was 7.3.  She was started on iron daily along with OCPs b.i.d. tapering down to control her bleeding, and presents at this time for D and C, hysteroscopy with Truclear resection. This procedure including specific risks related to bleeding, infection, adjacent organ injury, other complications may require additional surgery was discussed with her.  PAST MEDICAL HISTORY:  Allergies none.  CURRENT MEDICATIONS:  Nexium, OCP.  SURGICAL HISTORY:  Cesarean section in 2002, had toe surgery in 2012.  REVIEW OF SYSTEMS:  Significant for headache.  FAMILY HISTORY:  Significant for diabetes and unspecified cancer.  SOCIAL HISTORY:  She does drink socially.  Denies drug or tobacco use. She is married.  Dr. Posey ReaPlotnikov is her medical doctor.  PHYSICAL EXAMINATION:  VITAL SIGNS:  Temp 98.2, blood pressure 120/82. HEENT:  Unremarkable. NECK:  Supple without masses. LUNGS:  Clear. CARDIOVASCULAR:  Regular rate and rhythm without murmurs,  rubs, or gallops. BREASTS:  Without masses. ABDOMEN:  Soft, flat, nontender. PELVIC:  Vulva, vagina, cervix normal.  Uterus mid position, normal size.  Adnexa negative. EXTREMITIES:  Unremarkable. NEUROLOGIC:  Unremarkable.  IMPRESSION:  Endometrial polyp, menorrhagia with dysmenorrhea, and mild anemia.  PLAN:  D and C, hysteroscopy with Truclear.  Procedure and risks discussed as above.     [name] [name] M. Marcelle OverlieHolland, M.D.     [facility]/MEDQ  D:  06/30/2013  T:  07/01/2013  Job:  [id number]

## 2013-11-29 ENCOUNTER — Other Ambulatory Visit: Payer: Self-pay | Admitting: Internal Medicine

## 2013-12-01 ENCOUNTER — Telehealth: Payer: Self-pay

## 2013-12-01 NOTE — Telephone Encounter (Signed)
OK to fill this prescription with additional refills x0 [facility] Thank you!  

## 2013-12-01 NOTE — Telephone Encounter (Signed)
Pt wanted to know if she could have a refill on her phentermine 37.5 mg rx. Please advise

## 2013-12-02 MED ORDER — PHENTERMINE HCL 37.5 MG PO CAPS: 37.5000 mg | ORAL_CAPSULE | ORAL | Status: DC

## 2013-12-02 NOTE — Telephone Encounter (Signed)
Done

## 2014-01-31 ENCOUNTER — Emergency Department (HOSPITAL_COMMUNITY)
Admission: EM | Admit: 2014-01-31 | Discharge: 2014-01-31 | Disposition: A | Discharge status: Home / Self Care | Payer: BC Managed Care – PPO | Attending: Emergency Medicine | Admitting: Emergency Medicine

## 2014-01-31 ENCOUNTER — Encounter (HOSPITAL_COMMUNITY): Payer: Self-pay | Admitting: Emergency Medicine

## 2014-01-31 DIAGNOSIS — D649 Anemia, unspecified: Secondary | ICD-10-CM | POA: Insufficient documentation

## 2014-01-31 DIAGNOSIS — R42 Dizziness and giddiness: Secondary | ICD-10-CM | POA: Diagnosis not present

## 2014-01-31 DIAGNOSIS — R51 Headache: Secondary | ICD-10-CM | POA: Insufficient documentation

## 2014-01-31 DIAGNOSIS — Z3202 Encounter for pregnancy test, result negative: Secondary | ICD-10-CM | POA: Diagnosis not present

## 2014-01-31 DIAGNOSIS — N938 Other specified abnormal uterine and vaginal bleeding: Secondary | ICD-10-CM | POA: Diagnosis present

## 2014-01-31 DIAGNOSIS — Z79899 Other long term (current) drug therapy: Secondary | ICD-10-CM | POA: Diagnosis not present

## 2014-01-31 DIAGNOSIS — N925 Other specified irregular menstruation: Secondary | ICD-10-CM | POA: Diagnosis not present

## 2014-01-31 DIAGNOSIS — N949 Unspecified condition associated with female genital organs and menstrual cycle: Secondary | ICD-10-CM | POA: Insufficient documentation

## 2014-01-31 DIAGNOSIS — N939 Abnormal uterine and vaginal bleeding, unspecified: Secondary | ICD-10-CM

## 2014-01-31 DIAGNOSIS — Z9889 Other specified postprocedural states: Secondary | ICD-10-CM | POA: Diagnosis not present

## 2014-01-31 DIAGNOSIS — K219 Gastro-esophageal reflux disease without esophagitis: Secondary | ICD-10-CM | POA: Diagnosis not present

## 2014-01-31 LAB — CBC WITH DIFFERENTIAL/PLATELET
BASOS PCT: 0 % (ref 0–1)
Basophils Absolute: 0 10*3/uL (ref 0.0–0.1)
Eosinophils Absolute: 0.1 10*3/uL (ref 0.0–0.7)
Eosinophils Relative: 1 % (ref 0–5)
HCT: 23 % — ABNORMAL LOW (ref 36.0–46.0)
Hemoglobin: 7.8 g/dL — ABNORMAL LOW (ref 12.0–15.0)
LYMPHS ABS: 2.1 10*3/uL (ref 0.7–4.0)
Lymphocytes Relative: 22 % (ref 12–46)
MCH: 28 pg (ref 26.0–34.0)
MCHC: 33.9 g/dL (ref 30.0–36.0)
MCV: 82.4 fL (ref 78.0–100.0)
Monocytes Absolute: 0.5 10*3/uL (ref 0.1–1.0)
Monocytes Relative: 5 % (ref 3–12)
NEUTROS ABS: 6.6 10*3/uL (ref 1.7–7.7)
NEUTROS PCT: 72 % (ref 43–77)
PLATELETS: 320 10*3/uL (ref 150–400)
RBC: 2.79 MIL/uL — AB (ref 3.87–5.11)
RDW: 15.9 % — ABNORMAL HIGH (ref 11.5–15.5)
WBC: 9.2 10*3/uL (ref 4.0–10.5)

## 2014-01-31 LAB — COMPREHENSIVE METABOLIC PANEL
ALBUMIN: 3.1 g/dL — AB (ref 3.5–5.2)
ALK PHOS: 75 U/L (ref 39–117)
AST: 11 U/L (ref 0–37)
Anion gap: 12 (ref 5–15)
BUN: 11 mg/dL (ref 6–23)
CO2: 22 mEq/L (ref 19–32)
Calcium: 8.4 mg/dL (ref 8.4–10.5)
Chloride: 101 mEq/L (ref 96–112)
Creatinine, Ser: 0.8 mg/dL (ref 0.50–1.10)
GFR calc Af Amer: >90 mL/min (ref >90)
GFR calc non Af Amer: 88 mL/min — ABNORMAL LOW (ref >90)
Glucose, Bld: 111 mg/dL — ABNORMAL HIGH (ref 70–99)
POTASSIUM: 4.3 meq/L (ref 3.7–5.3)
SODIUM: 135 meq/L — AB (ref 137–147)
Total Bilirubin: 0.2 mg/dL — ABNORMAL LOW (ref 0.3–1.2)
Total Protein: 6.6 g/dL (ref 6.0–8.3)

## 2014-01-31 LAB — POC URINE PREG, ED: Preg Test, Ur: NEGATIVE

## 2014-01-31 MED ORDER — TRANEXAMIC ACID 650 MG PO TABS: 1300.0000 mg | ORAL_TABLET | Freq: Two times a day (BID) | ORAL | Status: DC

## 2014-01-31 MED ORDER — TRANEXAMIC ACID 650 MG PO TABS
1300.0000 mg | ORAL_TABLET | Freq: Once | ORAL | Status: DC
  Filled 2014-01-31: qty 2

## 2014-01-31 MED ORDER — TRANEXAMIC ACID 100 MG/ML IV SOLN
1000.0000 mg | Freq: Once | INTRAVENOUS | Status: DC
  Filled 2014-01-31: qty 10

## 2014-01-31 MED ORDER — SODIUM CHLORIDE 0.9 % IV BOLUS (SEPSIS)
1000.0000 mL | Freq: Once | INTRAVENOUS | Status: AC
  Administered 2014-01-31: 1000 mL via INTRAVENOUS

## 2014-01-31 NOTE — ED Notes (Signed)
Pt attempted to provide urine specimen. Pt could not provide one at this time.

## 2014-01-31 NOTE — ED Notes (Signed)
Per pt sts she has been having irregular vaginal bleeding for a few months. sts her doctor started her on birth control to stop the bleeding and sts it got lighter and since Friday she has been having large clots and feels light headed. Denies pain

## 2014-01-31 NOTE — ED Provider Notes (Signed)
CSN: [id number]     Arrival date & time 01/31/14  1354 History   First MD Initiated Contact with Patient 01/31/14 1601     Chief Complaint  Patient presents with  . Vaginal Bleeding    HPI [name] [name] is a 43 y.o. female with PMH of irregular vaginal bleeding presenting with passing multiple large clots from vagina since Friday and lightheadedness that started today with standing. She changes her tampon and pantyliner every two hours. Patient has been bleeding for 40 days straight and went to her OBGYN, Dr. Marcelle Overlie, Aug 13 who prescribed OCP tablets x2 daily which helped decrease the bleeding for two weeks but it increased last week. Patient notes SOB with walking. Patient denies abdominal pain, nausea, vomiting, hematochezia. Hematuria or bleeding problems. Patient with D&C in February to remove polps which helped her bleeding until it became irregular again in July/August.   Past Medical History  Diagnosis Date  . ALLERGIC RHINITIS   . CONSTIPATION   . GERD   . [id number](478.2)    Past Surgical History  Procedure Laterality Date  . Dilation and curettage of uterus  1999  . Cesarean section    . Dilation and curettage of uterus N/A 07/14/2013    Procedure: DILATATION AND CURETTAGE;  Surgeon: [name], MD;  Location: [facility];  Service: Gynecology;  Laterality: N/A;   Family History  Problem Relation Age of Onset  . Hypertension Mother   . Stroke Father    History  Substance Use Topics  . Smoking status: Never Smoker   . Smokeless tobacco: Not on file  . Alcohol Use: No     Comment: weekly   OB History   Grav Para Term Preterm Abortions TAB SAB Ect Mult Living                 Review of Systems  Constitutional: Negative for fever and chills.  HENT: Negative for congestion and rhinorrhea.   Eyes: Negative for visual disturbance.  Respiratory: Negative for cough and shortness of breath.   Cardiovascular: Negative for chest pain and palpitations.   Gastrointestinal: Negative for nausea, vomiting and diarrhea.  Genitourinary: Positive for vaginal bleeding. Negative for dysuria and hematuria.  Musculoskeletal: Negative for back pain and gait problem.  Skin: Negative for rash.  Neurological: Positive for light-headedness. Negative for weakness and headaches.      Allergies  Codeine  Home Medications   Prior to Admission medications   Medication Sig Start Date End Date Taking? Authorizing Provider  ranitidine (ZANTAC) 150 MG tablet Take 150 mg by mouth daily.   Yes Historical Provider, MD  SUMAtriptan (IMITREX) 100 MG tablet Take 1 tablet (100 mg total) by mouth every 2 (two) hours as needed for migraine. 04/06/13  Yes Aleksei Plotnikov V, MD  tranexamic acid (LYSTEDA) 650 MG TABS tablet Take 2 tablets (1,300 mg total) by mouth 2 (two) times daily. 02/01/14   Gerhard Munch, MD   BP 120/74  Pulse 95  Temp(Src) 98.8 F (37.1 C) (Oral)  Resp 16  SpO2 100% Physical Exam  Nursing note and vitals reviewed. Constitutional: She appears well-developed and well-nourished. No distress.  HENT:  Head: Normocephalic and atraumatic.  Eyes: Conjunctivae and EOM are normal. Right eye exhibits no discharge. Left eye exhibits no discharge. No scleral icterus.  Cardiovascular: Normal rate, regular rhythm, normal heart sounds and intact distal pulses.   Pulmonary/Chest: Effort normal and breath sounds normal. No respiratory distress. She has no wheezes.  Abdominal: Soft.  Bowel sounds are normal. She exhibits no distension. There is no tenderness.  Genitourinary: There is no tenderness or lesion on the right labia. There is no tenderness or lesion on the left labia. There is bleeding around the vagina. No foreign body around the vagina.  Cervix pink without lesions and os closed with bleeding but no other discharge. No clots in vault. No foul odor. Nursing tech in the room for the exam.  Musculoskeletal: Normal range of motion. She exhibits no  tenderness.  Neurological: She is alert. She exhibits normal muscle tone. Coordination normal.  Skin: Skin is warm and dry. She is not diaphoretic.  Psychiatric: She has a normal mood and affect. Her behavior is normal.    ED Course  Procedures (including critical care time) Labs Review Labs Reviewed  CBC WITH DIFFERENTIAL - Abnormal; Notable for the following:    RBC 2.79 (*)    Hemoglobin 7.8 (*)    HCT 23.0 (*)    RDW 15.9 (*)    All other components within normal limits  COMPREHENSIVE METABOLIC PANEL - Abnormal; Notable for the following:    Sodium 135 (*)    Glucose, Bld 111 (*)    Albumin 3.1 (*)    Total Bilirubin 0.2 (*)    GFR calc non Af Amer 88 (*)    All other components within normal limits  POC URINE PREG, ED    Imaging Review No results found.   EKG Interpretation None     Meds given in ED:  Medications  tranexamic acid (LYSTEDA) tablet 1,300 mg (not administered)  sodium chloride 0.9 % bolus 1,000 mL (0 mLs Intravenous Stopped 01/31/14 1856)    New Prescriptions   TRANEXAMIC ACID (LYSTEDA) 650 MG TABS TABLET    Take 2 tablets (1,300 mg total) by mouth 2 (two) times daily.      MDM   Final diagnoses:  Vaginal bleeding  Mild anemia   [name] [name] is a 43 y.o. female with PMH of irregular bleeding presenting with passage of large clots since Friday. She reports bleeding for the past 40 days but it is worse today and reports lightheadedness with standing. Patient actively passing clots in ED. VSS. Cervix pink without lesions with os closed. Patient hbg 7.8 with HCT 23. Patient given IV fluids with resolution of lightheadedness while walking. Patient is not pregnant. Other labs largely unremarkable. Discussed the case with physicians on call for Dr. Dennie Bible office who recommend tranexamic acid administration and follow up tomorrow in the office. Patient is afebrile, nontoxic, and in no acute distress. Patient is appropriate for outpatient management  and is stable for discharge.  Discussed return precautions with patient. Discussed all results and patient verbalizes understanding and agrees with plan.  This is a shared patient. This patient was discussed with the physician who saw and evaluated the patient.      Louann Sjogren, PA-C 02/01/14 1359

## 2014-01-31 NOTE — ED Notes (Signed)
Pharmacy called about med. They will call me back to let me know if they have it.

## 2014-01-31 NOTE — Discharge Instructions (Signed)
As discussed, it is important that you follow up as soon as possible with your physician for continued management of your condition.  If you develop any new, or concerning changes in your condition, please return to the emergency department immediately.  Abnormal Uterine Bleeding Abnormal uterine bleeding means bleeding from the vagina that is not your normal menstrual period. This can be:  Bleeding or spotting between periods.  Bleeding after sex (sexual intercourse).  Bleeding that is heavier or more than normal.  Periods that last longer than usual.  Bleeding after menopause. There are many problems that may cause this. Treatment will depend on the cause of the bleeding. Any kind of bleeding that is not normal should be reviewed by your doctor.  HOME CARE Watch your condition for any changes. These actions may lessen any discomfort you are having:  Do not use tampons or douches as told by your doctor.  Change your pads often. You should get regular pelvic exams and Pap tests. Keep all appointments for tests as told by your doctor. GET HELP IF:  You are bleeding for more than 1 week.  You feel dizzy at times. GET HELP RIGHT AWAY IF:   You pass out.  You have to change pads every 15 to 30 minutes.  You have belly pain.  You have a fever.  You become sweaty or weak.  You are passing large blood clots from the vagina.  You feel sick to your stomach (nauseous) and throw up (vomit). MAKE SURE YOU:  Understand these instructions.  Will watch your condition.  Will get help right away if you are not doing well or get worse. Document Released: 03/10/2009 Document Revised: 05/18/2013 Document Reviewed: 12/10/2012 [facility] Patient Information 2015 [place], [place]. This information is not intended to replace advice given to you by your health care provider. Make sure you discuss any questions you have with your health care provider.

## 2014-01-31 NOTE — ED Notes (Signed)
Pt has had vaginal bleeding for several months. Pt states her doctor put her on birth control, two pills a day, to stop the bleeding. Bleeding got lighter. Pt doctor told her to go down to one pill. Once patient went to one pill a day the bleeding got worse. She is not taking an iron pill. Pt [place] the beach, has been filling a pad per hour, cannot even sleep at night because she is constantly changing her pads. Pt said she had a "hysteroscopy." States she might just have to have a hysterectomy.

## 2014-02-01 ENCOUNTER — Observation Stay (HOSPITAL_COMMUNITY)
Admission: AD | Admit: 2014-02-01 | Discharge: 2014-02-02 | Disposition: A | Discharge status: Home / Self Care | Payer: BC Managed Care – PPO | Source: Ambulatory Visit | Attending: Obstetrics and Gynecology | Admitting: Obstetrics and Gynecology

## 2014-02-01 ENCOUNTER — Encounter (HOSPITAL_COMMUNITY): Payer: Self-pay | Admitting: *Deleted

## 2014-02-01 DIAGNOSIS — R5381 Other malaise: Secondary | ICD-10-CM | POA: Diagnosis not present

## 2014-02-01 DIAGNOSIS — R109 Unspecified abdominal pain: Secondary | ICD-10-CM | POA: Insufficient documentation

## 2014-02-01 DIAGNOSIS — M545 Low back pain, unspecified: Secondary | ICD-10-CM | POA: Diagnosis not present

## 2014-02-01 DIAGNOSIS — J309 Allergic rhinitis, unspecified: Secondary | ICD-10-CM

## 2014-02-01 DIAGNOSIS — D509 Iron deficiency anemia, unspecified: Secondary | ICD-10-CM | POA: Diagnosis not present

## 2014-02-01 DIAGNOSIS — H6120 Impacted cerumen, unspecified ear: Secondary | ICD-10-CM | POA: Insufficient documentation

## 2014-02-01 DIAGNOSIS — N938 Other specified abnormal uterine and vaginal bleeding: Secondary | ICD-10-CM | POA: Diagnosis not present

## 2014-02-01 DIAGNOSIS — N939 Abnormal uterine and vaginal bleeding, unspecified: Secondary | ICD-10-CM

## 2014-02-01 DIAGNOSIS — K5900 Constipation, unspecified: Secondary | ICD-10-CM | POA: Diagnosis not present

## 2014-02-01 DIAGNOSIS — N949 Unspecified condition associated with female genital organs and menstrual cycle: Secondary | ICD-10-CM | POA: Insufficient documentation

## 2014-02-01 DIAGNOSIS — R635 Abnormal weight gain: Secondary | ICD-10-CM | POA: Diagnosis not present

## 2014-02-01 DIAGNOSIS — R0789 Other chest pain: Secondary | ICD-10-CM

## 2014-02-01 DIAGNOSIS — N898 Other specified noninflammatory disorders of vagina: Secondary | ICD-10-CM | POA: Diagnosis present

## 2014-02-01 DIAGNOSIS — M940 Chondrocostal junction syndrome [Tietze]: Secondary | ICD-10-CM

## 2014-02-01 DIAGNOSIS — K219 Gastro-esophageal reflux disease without esophagitis: Secondary | ICD-10-CM | POA: Diagnosis not present

## 2014-02-01 DIAGNOSIS — R42 Dizziness and giddiness: Secondary | ICD-10-CM | POA: Diagnosis not present

## 2014-02-01 DIAGNOSIS — D500 Iron deficiency anemia secondary to blood loss (chronic): Secondary | ICD-10-CM

## 2014-02-01 DIAGNOSIS — H6122 Impacted cerumen, left ear: Secondary | ICD-10-CM

## 2014-02-01 DIAGNOSIS — R1031 Right lower quadrant pain: Secondary | ICD-10-CM

## 2014-02-01 DIAGNOSIS — R5383 Other fatigue: Secondary | ICD-10-CM

## 2014-02-01 DIAGNOSIS — R51 Headache: Secondary | ICD-10-CM

## 2014-02-01 DIAGNOSIS — M79604 Pain in right leg: Secondary | ICD-10-CM

## 2014-02-01 LAB — CBC
HEMATOCRIT: 20.3 % — AB (ref 36.0–46.0)
HEMOGLOBIN: 6.7 g/dL — AB (ref 12.0–15.0)
MCH: 27.3 pg (ref 26.0–34.0)
MCHC: 33 g/dL (ref 30.0–36.0)
MCV: 82.9 fL (ref 78.0–100.0)
Platelets: 309 10*3/uL (ref 150–400)
RBC: 2.45 MIL/uL — AB (ref 3.87–5.11)
RDW: 16.2 % — ABNORMAL HIGH (ref 11.5–15.5)
WBC: 9.3 10*3/uL (ref 4.0–10.5)

## 2014-02-01 LAB — PREPARE RBC (CROSSMATCH)

## 2014-02-01 LAB — ABO/RH: ABO/RH(D): O NEG

## 2014-02-01 MED ORDER — IBUPROFEN 600 MG PO TABS
600.0000 mg | ORAL_TABLET | Freq: Four times a day (QID) | ORAL | Status: DC | PRN
  Administered 2014-02-02: 600 mg via ORAL
  Filled 2014-02-01: qty 1

## 2014-02-01 MED ORDER — PRENATAL MULTIVITAMIN CH
1.0000 | ORAL_TABLET | Freq: Every day | ORAL | Status: DC
  Administered 2014-02-02: 1 via ORAL
  Filled 2014-02-01 (×2): qty 1

## 2014-02-01 MED ORDER — FAMOTIDINE 20 MG PO TABS
40.0000 mg | ORAL_TABLET | Freq: Two times a day (BID) | ORAL | Status: DC | PRN
  Administered 2014-02-01: 40 mg via ORAL
  Filled 2014-02-01: qty 2

## 2014-02-01 MED ORDER — MEGESTROL ACETATE 40 MG PO TABS
40.0000 mg | ORAL_TABLET | Freq: Three times a day (TID) | ORAL | Status: DC
  Administered 2014-02-02: 40 mg via ORAL
  Filled 2014-02-01: qty 1

## 2014-02-01 MED ORDER — SODIUM CHLORIDE 0.9 % IV SOLN
INTRAVENOUS | Status: DC
  Administered 2014-02-01 – 2014-02-02 (×2): via INTRAVENOUS

## 2014-02-01 MED ORDER — SODIUM CHLORIDE 0.9 % IV SOLN
Freq: Once | INTRAVENOUS | Status: AC
  Administered 2014-02-01: via INTRAVENOUS

## 2014-02-01 MED ORDER — MEGESTROL ACETATE 40 MG PO TABS
80.0000 mg | ORAL_TABLET | ORAL | Status: AC
  Administered 2014-02-01: 80 mg via ORAL
  Filled 2014-02-01: qty 2

## 2014-02-01 NOTE — MAU Note (Signed)
Pt states she has been having heavy bleeding for 43 days. Pt states she is now passing clots that look like liver. Pt states she has started to feel weak.

## 2014-02-01 NOTE — MAU Note (Signed)
CRITICAL VALUE ALERT  Critical value received:  HGB 6.7 Hct 20.3  Date of notification:  02/01/14  Time of notification:  1740  Critical value read back:Yes.    Nurse who received alert: Quintella Baton RNC  MD notified (1st page):  Sharen Counter CNM  Time of first page:  1750  MD notified (2nd page):  Time of second page:  Responding MD:  Sharen Counter CNM   Time MD responded:  1750

## 2014-02-01 NOTE — MAU Provider Note (Signed)
Chief Complaint: Vaginal Bleeding   First Provider Initiated Contact with Patient 02/01/14 1712     SUBJECTIVE HPI: [name] [name] is a 43 y.o. G2P1011 who presents to maternity admissions reporting heavy vaginal bleeding x4 days, starting with soaking 1 super tampon/hour, and increasing to soaking 1 large pad every 30 minutes to 1 hour.  She also reports dizziness and fatigue.  She had heavy bleeding starting 1 year ago, then had D&C in February 2015 which reduced her bleeding x 3-4 months.  In July, her bleeding started again, with off an on bleeding almost daily since July but not heavy until this weekend.  She saw Dr Marcelle Overlie August 15 and started a low estrogen OCP to reduce her bleeding which has not helped.  Yesterday, when she became weak and faint, she went to [facility].  She was prescribed tranexamic acid but her pharmacy was closed until this morning, when she picked up her first dose.  She continues to feel weak, and even missed her appt at the office today because she could not get out of the house without assistance. She denies abdominal pain, vaginal itching/burning, urinary symptoms, n/v, or fever/chills.          Medication List    STOP taking these medications       tranexamic acid 650 MG Tabs tablet  Commonly known as:  LYSTEDA      TAKE these medications       cetirizine 10 MG tablet  Commonly known as:  ZYRTEC  Take 10 mg by mouth as needed for allergies.     FERRALET 90 90-1 MG Tabs  Take 1 tablet by mouth daily.     ibuprofen 200 MG tablet  Commonly known as:  ADVIL,MOTRIN  Take 600 mg by mouth every 6 (six) hours as needed for headache.     megestrol 40 MG tablet  Commonly known as:  MEGACE  Take 1 tablet (40 mg total) by mouth 3 (three) times daily.     oxymetazoline 0.05 % nasal spray  Commonly known as:  AFRIN  Place 1 spray into both nostrils daily as needed for congestion.     ranitidine 150 MG tablet  Commonly known as:  ZANTAC  Take 150 mg by mouth  daily.     SUMAtriptan 100 MG tablet  Commonly known as:  IMITREX  Take 1 tablet (100 mg total) by mouth every 2 (two) hours as needed for migraine.        Past Medical History  Diagnosis Date  . ALLERGIC RHINITIS   . CONSTIPATION   . GERD   . ZOXWRUEA(540.9)    Past Surgical History  Procedure Laterality Date  . Dilation and curettage of uterus  1999  . Cesarean section    . Dilation and curettage of uterus N/A 07/14/2013    Procedure: DILATATION AND CURETTAGE;  Surgeon: [name], MD;  Location: [facility];  Service: Gynecology;  Laterality: N/A;   History   Social History  . Marital Status: Married    Spouse Name: N/A    Number of Children: N/A  . Years of Education: N/A   Occupational History  . Not on file.   Social History Main Topics  . Smoking status: Never Smoker   . Smokeless tobacco: Not on file  . Alcohol Use: No     Comment: weekly  . Drug Use: No  . Sexual Activity: Yes   Other Topics Concern  . Not on file  Social History Narrative  . No narrative on file   No current facility-administered medications on file prior to encounter.   Current Outpatient Prescriptions on File Prior to Encounter  Medication Sig Dispense Refill  . ranitidine (ZANTAC) 150 MG tablet Take 150 mg by mouth daily.      . SUMAtriptan (IMITREX) 100 MG tablet Take 1 tablet (100 mg total) by mouth every 2 (two) hours as needed for migraine.  10 tablet  3   Allergies  Allergen Reactions  . Codeine Nausea And Vomiting    ROS: Pertinent items in HPI  OBJECTIVE Blood pressure 115/56, pulse 71, temperature 98.6 F (37 C), temperature source Oral, resp. rate 20, height  (1.6 m), weight 97.07 kg (214 lb), last menstrual period 12/23/2013, SpO2 100.00%. GENERAL: Well-developed, well-nourished female in mild distress.  HEENT: Normocephalic HEART: normal rate RESP: normal effort ABDOMEN: Soft, non-tender EXTREMITIES: Nontender, no edema NEURO: Alert and  oriented SPECULUM EXAM: Deferred, done yesterday at [facility].  Pad count with 1 pad soaked in less than 1 hour upon arrival in MAU with golf-ball sized clots noted.   LAB RESULTS  Results for orders placed during the hospital encounter of 02/01/14 (from the past 168 hour(s))  CBC   Collection Time    02/01/14  5:18 PM      Result Value Ref Range   WBC 9.3  4.0 - 10.5 K/uL   RBC 2.45 (*) 3.87 - 5.11 MIL/uL   Hemoglobin 6.7 (*) 12.0 - 15.0 g/dL   HCT 16.1 (*) 09.6 - 04.5 %   MCV 82.9  78.0 - 100.0 fL   MCH 27.3  26.0 - 34.0 pg   MCHC 33.0  30.0 - 36.0 g/dL   RDW 40.9 (*) 81.1 - 91.4 %   Platelets 309  150 - 400 K/uL  PREPARE RBC (CROSSMATCH)   Collection Time    02/01/14  8:00 PM      Result Value Ref Range   Order Confirmation ORDER PROCESSED BY BLOOD BANK    TYPE AND SCREEN   Collection Time    02/01/14  8:01 PM      Result Value Ref Range   ABO/RH(D) O NEG     Antibody Screen NEG     Sample Expiration 02/04/2014     Unit Number [id number]     Blood Component Type RED CELLS,LR     Unit division 00     Status of Unit ISSUED,FINAL     Transfusion Status OK TO TRANSFUSE     Crossmatch Result Compatible     Unit Number [id number]     Blood Component Type RED CELLS,LR     Unit division 00     Status of Unit ISSUED,FINAL     Transfusion Status OK TO TRANSFUSE     Crossmatch Result Compatible     Unit Number [id number]     Blood Component Type RED CELLS,LR     Unit division 00     Status of Unit ISSUED,FINAL     Transfusion Status OK TO TRANSFUSE     Crossmatch Result Compatible    ABO/RH   Collection Time    02/01/14  8:05 PM      Result Value Ref Range   ABO/RH(D) O NEG    Results for orders placed during the hospital encounter of 01/31/14 (from the past 168 hour(s))  CBC WITH DIFFERENTIAL   Collection Time    01/31/14  2:15 PM      Result  Value Ref Range   WBC 9.2  4.0 - 10.5 K/uL   RBC 2.79 (*) 3.87 - 5.11 MIL/uL   Hemoglobin 7.8 (*) 12.0 - 15.0 g/dL    HCT 91.4 (*) 78.2 - 46.0 %   MCV 82.4  78.0 - 100.0 fL   MCH 28.0  26.0 - 34.0 pg   MCHC 33.9  30.0 - 36.0 g/dL   RDW 95.6 (*) 21.3 - 08.6 %   Platelets 320  150 - 400 K/uL   Neutrophils Relative % 72  43 - 77 %   Neutro Abs 6.6  1.7 - 7.7 K/uL   Lymphocytes Relative 22  12 - 46 %   Lymphs Abs 2.1  0.7 - 4.0 K/uL   Monocytes Relative 5  3 - 12 %   Monocytes Absolute 0.5  0.1 - 1.0 K/uL   Eosinophils Relative 1  0 - 5 %   Eosinophils Absolute 0.1  0.0 - 0.7 K/uL   Basophils Relative 0  0 - 1 %   Basophils Absolute 0.0  0.0 - 0.1 K/uL  COMPREHENSIVE METABOLIC PANEL   Collection Time    01/31/14  2:15 PM      Result Value Ref Range   Sodium 135 (*) 137 - 147 mEq/L   Potassium 4.3  3.7 - 5.3 mEq/L   Chloride 101  96 - 112 mEq/L   CO2 22  19 - 32 mEq/L   Glucose, Bld 111 (*) 70 - 99 mg/dL   BUN 11  6 - 23 mg/dL   Creatinine, Ser 5.78  0.50 - 1.10 mg/dL   Calcium 8.4  8.4 - 46.9 mg/dL   Total Protein 6.6  6.0 - 8.3 g/dL   Albumin 3.1 (*) 3.5 - 5.2 g/dL   AST 11  0 - 37 U/L   ALT <5  0 - 35 U/L   Alkaline Phosphatase 75  39 - 117 U/L   Total Bilirubin 0.2 (*) 0.3 - 1.2 mg/dL   GFR calc non Af Amer 88 (*) >90 mL/min   GFR calc Af Amer >90  >90 mL/min   Anion gap 12  5 - 15  POC URINE PREG, ED   Collection Time    01/31/14  4:09 PM      Result Value Ref Range   Preg Test, Ur NEGATIVE  NEGATIVE     ASSESSMENT 1. 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     FERRALET 90 90-1 MG Tabs  Take 1 tablet by mouth daily.     ibuprofen 200 MG tablet  Commonly known as:   ADVIL,MOTRIN  Take 600 mg by mouth every 6 (six) hours as needed for headache.     megestrol 40 MG tablet  Commonly known as:  MEGACE  Take 1 tablet (40 mg total) by mouth 3 (three) times daily.     oxymetazoline 0.05 % nasal spray  Commonly known as:  AFRIN  Place 1 spray  into both nostrils daily as needed for congestion.     ranitidine 150 MG tablet  Commonly known as:  ZANTAC  Take 150 mg by mouth daily.     SUMAtriptan 100 MG tablet  Commonly known as:  IMITREX  Take 1 tablet (100 mg total) by mouth every 2 (two) hours as needed for migraine.         Sharen Counter Certified Nurse-Midwife 02/05/2014  4:37 PM

## 2014-02-01 NOTE — H&P (Signed)
[name] [name] is an 43 y.o. female with history of heavy menses. S/P H/S , D&C with Dr Marcelle Overlie in Feb of 2015-benign pathology. Bleeding better for a few months. She again had heavy menses most recently treated by Dr Marcelle Overlie with OCPs in August, 2015. She was taking BID, initially with some reduction in flow, but recently no help. Evaluated in [facility] yesterday after a large amount of vaginal bleeding soaked her pants. Was D/C home on tranexamic acid. Pharmacy was closed for holiday and took first dose this am. Today has increasing fatigue and dizziness when on her feet. Unable to stand at sink to wash hands without holding on.   Pertinent Gynecological History: Menses: flow is excessive with use of many pads or tampons on heaviest days Bleeding: dysfunctional uterine bleeding Contraception: OCP (estrogen/progesterone) DES exposure: denies Blood transfusions: none Sexually transmitted diseases: no past history Previous GYN Procedures: DNC  Last mammogram: unknown Date: N/A Last pap: normal Date: 2015 OB History: G2, P1   Menstrual History: Menarche age: unknown  Patient's last menstrual period was 12/23/2013.    Past Medical History  Diagnosis Date  . ALLERGIC RHINITIS   . CONSTIPATION   . GERD   . RUEAVWUJ(811.9)     Past Surgical History  Procedure Laterality Date  . Dilation and curettage of uterus  1999  . Cesarean section    . Dilation and curettage of uterus N/A 07/14/2013    Procedure: DILATATION AND CURETTAGE;  Surgeon: [name], MD;  Location: [facility];  Service: Gynecology;  Laterality: N/A;    Family History  Problem Relation Age of Onset  . Hypertension Mother   . Stroke Father     Social History:  reports that she has never smoked. She does not have any smokeless tobacco history on file. She reports that she does not drink alcohol or use illicit drugs.  Allergies:  Allergies  Allergen Reactions  . Codeine Nausea And Vomiting    Prescriptions prior to  admission  Medication Sig Dispense Refill  . cetirizine (ZYRTEC) 10 MG tablet Take 10 mg by mouth as needed for allergies.      . Fe Cbn-Fe Gluc-FA-B12-C-DSS (FERRALET 90) 90-1 MG TABS Take 1 tablet by mouth daily.      Marland Kitchen ibuprofen (ADVIL,MOTRIN) 200 MG tablet Take 600 mg by mouth every 6 (six) hours as needed for headache.      Marland Kitchen oxymetazoline (AFRIN) 0.05 % nasal spray Place 1 spray into both nostrils daily as needed for congestion.      . ranitidine (ZANTAC) 150 MG tablet Take 150 mg by mouth daily.      . SUMAtriptan (IMITREX) 100 MG tablet Take 1 tablet (100 mg total) by mouth every 2 (two) hours as needed for migraine.  10 tablet  3  . tranexamic acid (LYSTEDA) 650 MG TABS tablet Take 2 tablets (1,300 mg total) by mouth 2 (two) times daily.  10 tablet  0    Review of Systems  Constitutional: Positive for malaise/fatigue.  Neurological: Positive for dizziness and weakness.    Blood pressure 116/52, pulse 102, temperature 99.4 F (37.4 C), temperature source Oral, resp. rate 18, weight 97.41 kg (214 lb 12 oz), last menstrual period 12/23/2013, SpO2 100.00%. Physical Exam  Cardiovascular: Normal rate and regular rhythm.   Respiratory: Effort normal.  GI: Soft.    Results for orders placed during the hospital encounter of 02/01/14 (from the past 24 hour(s))  CBC     Status: Abnormal  Collection Time    02/01/14  5:18 PM      Result Value Ref Range   WBC 9.3  4.0 - 10.5 K/uL   RBC 2.45 (*) 3.87 - 5.11 MIL/uL   Hemoglobin 6.7 (*) 12.0 - 15.0 g/dL   HCT 16.1 (*) 09.6 - 04.5 %   MCV 82.9  78.0 - 100.0 fL   MCH 27.3  26.0 - 34.0 pg   MCHC 33.0  30.0 - 36.0 g/dL   RDW 40.9 (*) 81.1 - 91.4 %   Platelets 309  150 - 400 K/uL  PREPARE RBC (CROSSMATCH)     Status: None   Collection Time    02/01/14  8:00 PM      Result Value Ref Range   Order Confirmation ORDER PROCESSED BY BLOOD BANK    TYPE AND SCREEN     Status: None   Collection Time    02/01/14  8:01 PM      Result Value  Ref Range   ABO/RH(D) O NEG     Antibody Screen NEG     Sample Expiration 02/04/2014     Unit Number [id number]     Blood Component Type RED CELLS,LR     Unit division 00     Status of Unit ALLOCATED     Transfusion Status OK TO TRANSFUSE     Crossmatch Result Compatible     Unit Number [id number]     Blood Component Type RED CELLS,LR     Unit division 00     Status of Unit ALLOCATED     Transfusion Status OK TO TRANSFUSE     Crossmatch Result Compatible     Unit Number [id number]     Blood Component Type RED CELLS,LR     Unit division 00     Status of Unit ALLOCATED     Transfusion Status OK TO TRANSFUSE     Crossmatch Result Compatible    ABO/RH     Status: None   Collection Time    02/01/14  8:05 PM      Result Value Ref Range   ABO/RH(D) O NEG      No results found.  Assessment/Plan: 43 yo G2P1 with menorrhagia and symptomatic anemia. Megace  po now and 40 mg in am D/W patient above and options In view of symptomatic anemia, will transfuse 3 units PRBC D/W risks including HIV/Hep, transfusion reaction Check CBC in am  Prisila Dlouhy II,Reilley Latorre E 02/01/2014, 11:16 PM

## 2014-02-01 NOTE — MAU Note (Signed)
abd period, bled for 41days. Last 5 days, has been passing golf ball sized clots.   Saw dr after a couple wks, was put on birth control pills, started to get better then got bad again. . Got bad over weekend- went to [facility].  Feeling faint, week and nauseated.

## 2014-02-02 LAB — CBC
HCT: 28.2 % — ABNORMAL LOW (ref 36.0–46.0)
Hemoglobin: 9.5 g/dL — ABNORMAL LOW (ref 12.0–15.0)
MCH: 28.4 pg (ref 26.0–34.0)
MCHC: 33.7 g/dL (ref 30.0–36.0)
MCV: 84.4 fL (ref 78.0–100.0)
PLATELETS: 291 10*3/uL (ref 150–400)
RBC: 3.34 MIL/uL — ABNORMAL LOW (ref 3.87–5.11)
RDW: 15.8 % — AB (ref 11.5–15.5)
WBC: 10 10*3/uL (ref 4.0–10.5)

## 2014-02-02 MED ORDER — MEGESTROL ACETATE 40 MG PO TABS: 40.0000 mg | ORAL_TABLET | Freq: Three times a day (TID) | ORAL | Status: DC

## 2014-02-02 NOTE — Progress Notes (Signed)
Post discharge chart review completed.  

## 2014-02-02 NOTE — Progress Notes (Signed)
Discharge teaching complete. Pt understood all instructions did not have any questions. Pt ambulated out of the hospital with tech and discharged home to family.

## 2014-02-02 NOTE — ED Provider Notes (Signed)
  This was a shared visit with a mid-level provided (NP or PA).  Throughout the patient's course I was available for consultation/collaboration.  I saw the ECG (if appropriate), relevant labs and studies - I agree with the interpretation.  On my exam the patient was in no distress.  However, she continued to have mild vaginal bleeding. She and I discussed her recent increase in oral contraceptives, with no change in her bleeding. I discussed the patient's case with her gynecology team, we arranged Followup, started new medication, and the patient was discharged in stable condition.      Gerhard Munch, MD 02/02/14 [phone]

## 2014-02-02 NOTE — Discharge Summary (Signed)
Admission Diagnosis: Dysfunctional Uterine Bleeding Severe Anemia  Discharge Diagnosis: Same Status post pRBC Transfusion  Hospital Course: 43 year old female with DUB admitted yesterday pm with severe anemia. DUB has responded very well to po Megace. This am she is having scant bleeding that requires only a panti liner. She was severely anemic and received 3 units of RBCs and will have a hemoglobin check prior to discharge. She was given strict discharge precautions.  She will follow up with Dr. Marcelle Overlie next week. She is going to consider Hysterectomy versus endometrial ablation to correct her DUB and will discuss with Dr. Marcelle Overlie.

## 2014-02-03 LAB — TYPE AND SCREEN
ABO/RH(D): O NEG
Antibody Screen: NEGATIVE
UNIT DIVISION: 0
Unit division: 0
Unit division: 0

## 2014-02-21 NOTE — H&P (Signed)
MELODYE SWOR  DICTATION # [id number] CSN# [id number]   [name], MD 02/21/2014 2:00 PM

## 2014-02-24 NOTE — H&P (Signed)
NAMMeriel [name]:  [name] [name]              ACCOUNT NO.:  [id number]  MEDICAL RECORD NO.:  [id number]  LOCATION:                                 FACILITY:  PHYSICIAN:  Duke Salviaichard M. Marcelle OverlieHolland, M.D.DATE OF BIRTH:  1970/10/06  DATE OF ADMISSION:  03/04/2014 DATE OF DISCHARGE:  03/04/2014                             HISTORY & PHYSICAL   CHIEF COMPLAINT:  Heavy vaginal bleeding.  HPI:  Patient is a 43 year old, G2, P1, who underwent D and C in March of this year for benign polyp and menorrhagia, was placed on iron at that time, with normal pathology.  She presented this fall in August complaining of heavier bleeding.  She was placed on twice daily OCPs with the plan to taper down.  [facility] of increased bleeding, was hospitalized, started on Megace and was given 3 units blood transfusion.  She presents now for outpatient D and C with NovaSure endometrial ablation.  This procedure including specific risks related to bleeding, infection, and other complications such as perforation may require additional surgery.  All reviewed with her which she understands and accepts.  She is currently on Megace once daily to try to down regulate her bleeding.  PAST MEDICAL HISTORY:  Significant for allergic rhinitis, constipation, reflux, and headache.  PRIOR SURGERY:  Cesarean section.  D and C x2.  FAMILY HISTORY:  Significant for hypertension and stroke.  ALLERGIES:  Codeine.  CURRENT MEDICATIONS:  Zyrtec, Ferralet, ibuprofen, Zantac, Imitrex p.r.n.  PHYSICAL EXAMINATION:  VITAL SIGNS:  Temp 98.2, blood pressure 120/78. HEENT:  Unremarkable. NECK:  Supple without masses. LUNGS:  Clear. CARDIOVASCULAR:  Regular rate and rhythm without murmurs, rubs, gallops. BREASTS:  Without masses. ABDOMEN:  Soft, flat, nontender.  Vulva, vagina, cervix normal.  Uterus mid position, upper limit of normal size.  Adnexa negative.  Pap 11/14 was normal. Extremities:   Unremarkable. Neurologic:  Unremarkable.  IMPRESSION:  Menorrhagia with anemia.  PLAN:  Hysteroscopy with NovaSure endometrial ablation.  Procedure and risks discussed as above.     [name] [name] M. Marcelle OverlieHolland, M.D.     RMH/MEDQ  D:  02/21/2014  T:  02/22/2014  Job:  [id number]

## 2014-02-25 ENCOUNTER — Encounter (HOSPITAL_BASED_OUTPATIENT_CLINIC_OR_DEPARTMENT_OTHER): Payer: Self-pay | Admitting: *Deleted

## 2014-02-28 ENCOUNTER — Encounter (HOSPITAL_BASED_OUTPATIENT_CLINIC_OR_DEPARTMENT_OTHER): Payer: Self-pay | Admitting: *Deleted

## 2014-02-28 NOTE — Progress Notes (Signed)
NPO AFTER MN. ARRIVE AT 0600. PT TO GET LAB WORK DONE Thursday 03-03-2014 BETWEEN 1100 AND 1430.

## 2014-03-03 DIAGNOSIS — N920 Excessive and frequent menstruation with regular cycle: Secondary | ICD-10-CM | POA: Diagnosis not present

## 2014-03-03 DIAGNOSIS — D649 Anemia, unspecified: Secondary | ICD-10-CM | POA: Diagnosis not present

## 2014-03-03 DIAGNOSIS — K219 Gastro-esophageal reflux disease without esophagitis: Secondary | ICD-10-CM | POA: Diagnosis not present

## 2014-03-03 LAB — CBC
HCT: 35.8 % — ABNORMAL LOW (ref 36.0–46.0)
Hemoglobin: 12.1 g/dL (ref 12.0–15.0)
MCH: 27.7 pg (ref 26.0–34.0)
MCHC: 33.8 g/dL (ref 30.0–36.0)
MCV: 81.9 fL (ref 78.0–100.0)
PLATELETS: 387 10*3/uL (ref 150–400)
RBC: 4.37 MIL/uL (ref 3.87–5.11)
RDW: 13.8 % (ref 11.5–15.5)
WBC: 10 10*3/uL (ref 4.0–10.5)

## 2014-03-04 ENCOUNTER — Encounter (HOSPITAL_BASED_OUTPATIENT_CLINIC_OR_DEPARTMENT_OTHER): Payer: Self-pay | Admitting: *Deleted

## 2014-03-04 ENCOUNTER — Encounter (HOSPITAL_BASED_OUTPATIENT_CLINIC_OR_DEPARTMENT_OTHER): Payer: BC Managed Care – PPO | Admitting: Anesthesiology

## 2014-03-04 ENCOUNTER — Ambulatory Visit (HOSPITAL_BASED_OUTPATIENT_CLINIC_OR_DEPARTMENT_OTHER): Payer: BC Managed Care – PPO | Admitting: Anesthesiology

## 2014-03-04 ENCOUNTER — Encounter (HOSPITAL_BASED_OUTPATIENT_CLINIC_OR_DEPARTMENT_OTHER)
Admission: RE | Disposition: A | Discharge status: Home / Self Care | Payer: Self-pay | Source: Ambulatory Visit | Attending: Obstetrics and Gynecology

## 2014-03-04 ENCOUNTER — Ambulatory Visit (HOSPITAL_BASED_OUTPATIENT_CLINIC_OR_DEPARTMENT_OTHER)
Admission: RE | Admit: 2014-03-04 | Discharge: 2014-03-04 | Disposition: A | Discharge status: Home / Self Care | Payer: BC Managed Care – PPO | Source: Ambulatory Visit | Attending: Obstetrics and Gynecology | Admitting: Obstetrics and Gynecology

## 2014-03-04 DIAGNOSIS — K219 Gastro-esophageal reflux disease without esophagitis: Secondary | ICD-10-CM | POA: Insufficient documentation

## 2014-03-04 DIAGNOSIS — D649 Anemia, unspecified: Secondary | ICD-10-CM | POA: Insufficient documentation

## 2014-03-04 DIAGNOSIS — N920 Excessive and frequent menstruation with regular cycle: Secondary | ICD-10-CM | POA: Diagnosis not present

## 2014-03-04 HISTORY — DX: Unspecified temporomandibular joint disorder, unspecified side: M26.609

## 2014-03-04 HISTORY — DX: Gastro-esophageal reflux disease without esophagitis: K21.9

## 2014-03-04 HISTORY — DX: Allergic rhinitis, unspecified: J30.9

## 2014-03-04 HISTORY — DX: Abnormal uterine and vaginal bleeding, unspecified: N93.9

## 2014-03-04 HISTORY — PX: DILITATION & CURRETTAGE/HYSTROSCOPY WITH NOVASURE ABLATION: SHX5568

## 2014-03-04 HISTORY — DX: Presence of spectacles and contact lenses: Z97.3

## 2014-03-04 HISTORY — DX: Anemia, unspecified: D64.9

## 2014-03-04 LAB — POCT PREGNANCY, URINE: PREG TEST UR: NEGATIVE

## 2014-03-04 SURGERY — DILATATION & CURETTAGE/HYSTEROSCOPY WITH NOVASURE ABLATION
Anesthesia: General | Site: Vagina

## 2014-03-04 MED ORDER — DEXAMETHASONE SODIUM PHOSPHATE 4 MG/ML IJ SOLN
INTRAMUSCULAR | Status: DC | PRN
  Administered 2014-03-04: 10 mg via INTRAVENOUS

## 2014-03-04 MED ORDER — OXYCODONE-ACETAMINOPHEN 5-325 MG PO TABS
1.0000 | ORAL_TABLET | ORAL | Status: DC | PRN
  Administered 2014-03-04: 1 via ORAL
  Filled 2014-03-04: qty 1

## 2014-03-04 MED ORDER — MIDAZOLAM HCL 2 MG/2ML IJ SOLN
INTRAMUSCULAR | Status: AC
  Filled 2014-03-04: qty 2

## 2014-03-04 MED ORDER — ACETAMINOPHEN 10 MG/ML IV SOLN
INTRAVENOUS | Status: DC | PRN
  Administered 2014-03-04: 1000 mg via INTRAVENOUS

## 2014-03-04 MED ORDER — PROMETHAZINE HCL 25 MG/ML IJ SOLN
6.2500 mg | INTRAMUSCULAR | Status: DC | PRN
  Filled 2014-03-04: qty 1

## 2014-03-04 MED ORDER — FENTANYL CITRATE 0.05 MG/ML IJ SOLN
INTRAMUSCULAR | Status: AC
  Filled 2014-03-04: qty 4

## 2014-03-04 MED ORDER — LIDOCAINE HCL 1 % IJ SOLN
INTRAMUSCULAR | Status: DC | PRN
  Administered 2014-03-04: 9 mL

## 2014-03-04 MED ORDER — LACTATED RINGERS IR SOLN
Status: DC | PRN
  Administered 2014-03-04: 3000 mL

## 2014-03-04 MED ORDER — DEXTROSE 5 % IV SOLN
2.0000 g | INTRAVENOUS | Status: AC
  Administered 2014-03-04: 2 g via INTRAVENOUS
  Filled 2014-03-04: qty 2

## 2014-03-04 MED ORDER — MEPERIDINE HCL 25 MG/ML IJ SOLN
6.2500 mg | INTRAMUSCULAR | Status: DC | PRN
  Filled 2014-03-04: qty 1

## 2014-03-04 MED ORDER — LACTATED RINGERS IV SOLN
INTRAVENOUS | Status: DC
  Filled 2014-03-04: qty 1000

## 2014-03-04 MED ORDER — KETOROLAC TROMETHAMINE 30 MG/ML IJ SOLN
INTRAMUSCULAR | Status: DC | PRN
  Administered 2014-03-04: 30 mg via INTRAVENOUS

## 2014-03-04 MED ORDER — CEFOTETAN DISODIUM-DEXTROSE 2-2.08 GM-% IV SOLR
INTRAVENOUS | Status: AC
  Filled 2014-03-04: qty 50

## 2014-03-04 MED ORDER — PROPOFOL 10 MG/ML IV BOLUS
INTRAVENOUS | Status: DC | PRN
  Administered 2014-03-04: 200 mg via INTRAVENOUS
  Administered 2014-03-04: 100 mg via INTRAVENOUS

## 2014-03-04 MED ORDER — LACTATED RINGERS IV SOLN
INTRAVENOUS | Status: DC
  Administered 2014-03-04: 07:00:00 via INTRAVENOUS
  Filled 2014-03-04: qty 1000

## 2014-03-04 MED ORDER — FENTANYL CITRATE 0.05 MG/ML IJ SOLN
25.0000 ug | INTRAMUSCULAR | Status: DC | PRN
  Administered 2014-03-04: 25 ug via INTRAVENOUS
  Filled 2014-03-04: qty 1

## 2014-03-04 MED ORDER — OXYCODONE-ACETAMINOPHEN 5-325 MG PO TABS: 1.0000 | ORAL_TABLET | ORAL | Status: DC | PRN

## 2014-03-04 MED ORDER — PROMETHAZINE HCL 12.5 MG PO TABS: 12.5000 mg | ORAL_TABLET | Freq: Four times a day (QID) | ORAL | Status: DC | PRN

## 2014-03-04 MED ORDER — OXYCODONE-ACETAMINOPHEN 5-325 MG PO TABS
ORAL_TABLET | ORAL | Status: AC
Start: 2014-03-04 — End: 2014-03-04
  Filled 2014-03-04: qty 1

## 2014-03-04 MED ORDER — LIDOCAINE HCL (CARDIAC) 20 MG/ML IV SOLN
INTRAVENOUS | Status: DC | PRN
  Administered 2014-03-04: 100 mg via INTRAVENOUS

## 2014-03-04 MED ORDER — FENTANYL CITRATE 0.05 MG/ML IJ SOLN
INTRAMUSCULAR | Status: AC
  Filled 2014-03-04: qty 2

## 2014-03-04 MED ORDER — PROMETHAZINE HCL 12.5 MG PO TABS
12.5000 mg | ORAL_TABLET | Freq: Four times a day (QID) | ORAL | Status: DC | PRN
  Administered 2014-03-04: 12.5 mg via ORAL
  Filled 2014-03-04: qty 1

## 2014-03-04 MED ORDER — MIDAZOLAM HCL 5 MG/5ML IJ SOLN
INTRAMUSCULAR | Status: DC | PRN
  Administered 2014-03-04: 2 mg via INTRAVENOUS

## 2014-03-04 MED ORDER — FENTANYL CITRATE 0.05 MG/ML IJ SOLN
INTRAMUSCULAR | Status: DC | PRN
  Administered 2014-03-04: 50 ug via INTRAVENOUS

## 2014-03-04 SURGICAL SUPPLY — 36 items
ABLATOR ENDOMETRIAL BIPOLAR (ABLATOR) ×2 IMPLANT
CANISTER SUCTION 2500CC (MISCELLANEOUS) ×3 IMPLANT
CATH ROBINSON RED A/P 16FR (CATHETERS) IMPLANT
CLOTH BEACON ORANGE TIMEOUT ST (SAFETY) ×3 IMPLANT
CORD ACTIVE DISPOSABLE (ELECTRODE)
CORD ELECTRO ACTIVE DISP (ELECTRODE) IMPLANT
COVER TABLE BACK 60X90 (DRAPES) ×3 IMPLANT
DRAPE CAMERA CLOSED 9X96 (DRAPES) ×3 IMPLANT
DRAPE LG THREE QUARTER DISP (DRAPES) ×3 IMPLANT
DRESSING TELFA 8X3 (GAUZE/BANDAGES/DRESSINGS) ×3 IMPLANT
ELECT LOOP GYNE PRO 24FR (CUTTING LOOP)
ELECT REM PT RETURN 9FT ADLT (ELECTROSURGICAL) ×3
ELECT VAPORTRODE GRVD BAR (ELECTRODE) IMPLANT
ELECTRODE LOOP GYNE PRO 24FR (CUTTING LOOP) IMPLANT
ELECTRODE REM PT RTRN 9FT ADLT (ELECTROSURGICAL) ×1 IMPLANT
ELECTRODE ROLLER BARREL 22FR (ELECTROSURGICAL) IMPLANT
ELECTRODE VAPORCUT 22FR (ELECTROSURGICAL) IMPLANT
GLOVE BIO SURGEON STRL SZ 6.5 (GLOVE) ×1 IMPLANT
GLOVE BIO SURGEON STRL SZ7 (GLOVE) ×6 IMPLANT
GLOVE BIO SURGEONS STRL SZ 6.5 (GLOVE) ×1
GLOVE BIOGEL PI IND STRL 6.5 (GLOVE) IMPLANT
GLOVE BIOGEL PI IND STRL 7.5 (GLOVE) ×1 IMPLANT
GLOVE BIOGEL PI INDICATOR 6.5 (GLOVE) ×2
GLOVE BIOGEL PI INDICATOR 7.5 (GLOVE) ×2
GOWN PREVENTION PLUS LG XLONG (DISPOSABLE) ×3 IMPLANT
GOWN STRL REIN XL XLG (GOWN DISPOSABLE) ×3 IMPLANT
GOWN STRL REUS W/TWL XL LVL3 (GOWN DISPOSABLE) ×3 IMPLANT
LEGGING LITHOTOMY PAIR STRL (DRAPES) ×3 IMPLANT
LOOP ANGLED CUTTING 22FR (CUTTING LOOP) IMPLANT
NOVASURE ×3 IMPLANT
PACK BASIN DAY SURGERY FS (CUSTOM PROCEDURE TRAY) IMPLANT
PAD OB MATERNITY 4.3X12.25 (PERSONAL CARE ITEMS) ×3 IMPLANT
PAD PREP 24X48 CUFFED NSTRL (MISCELLANEOUS) ×3 IMPLANT
TOWEL OR 17X24 6PK STRL BLUE (TOWEL DISPOSABLE) ×6 IMPLANT
TUBING HYDROFLEX HYSTEROSCOPY (TUBING) ×3 IMPLANT
WATER STERILE IRR 500ML POUR (IV SOLUTION) ×3 IMPLANT

## 2014-03-04 NOTE — Progress Notes (Signed)
The patient was re-examined with no change in status 

## 2014-03-04 NOTE — Anesthesia Postprocedure Evaluation (Signed)
  Anesthesia Post-op Note  Patient: [name] [name]  Procedure(s) Performed: Procedure(s) (LRB): DILATATION & CURETTAGE/HYSTEROSCOPY WITH NOVASURE ABLATION (N/A)  Patient Location: PACU  Anesthesia Type: General  Level of Consciousness: awake and alert   Airway and Oxygen Therapy: Patient Spontanous Breathing  Post-op Pain: mild  Post-op Assessment: Post-op Vital signs reviewed, Patient's Cardiovascular Status Stable, Respiratory Function Stable, Patent Airway and No signs of Nausea or vomiting  Last Vitals:  Filed Vitals:   03/04/14 0830  BP: 137/91  Pulse: 80  Temp:   Resp: 16    Post-op Vital Signs: stable   Complications: No apparent anesthesia complications

## 2014-03-04 NOTE — Op Note (Signed)
Preoperative diagnosis: Menorrhagia with anemia  Postoperative diagnosis: Same  Procedure: Hysteroscopy with D&C, paracervical block, NovaSure endometrial ablation  Surgeon: Marcelle OverlieHolland  Anesthesia: Gen.  EBL: Less than 50 cc  Complications: None  Specimens removed: Endometrial curettings, to pathology  Procedure and findings:  The patient taken the operating room after an adequate level of general anesthesia was obtained with the legs in stirrups the patient was prepped and draped in usual fashion for D&C, the bladder was drained. Appropriate timeout taken at that point. The uterus itself is upper limit normal size mid position, adnexa negative. Speculum was positioned, cervix grasped with tenaculum, paracervical block was then created by infiltrating at 3 and 9:00 submucosally 5-7 cc 1% plain Xylocaine at each site after negative aspiration. The uterus was sounded to 10 cm with a cervical length of 3.0 with dilated to a 7-8 Hegar dilator, the continuous flow hysteroscope was inserted there was some moderate endometrial buildup but the cavity was otherwise unremarkable. Sharp curettage was carried out revealing minimal tissue this was sent to pathology, the scope was reinserted the cavity was irrigated and noted to be otherwise unremarkable. The NovaSure device was then placed per protocol, passing the CO2 testing and the treatment cycle was well-tolerated. Instruments removed. She tolerated this well went to recovery room in good condition. She did receive Toradol intraoperatively.  Dictated with [facility]  [name] [name] M.D.

## 2014-03-04 NOTE — Discharge Instructions (Signed)
° °  D & C Home care Instructions: ° ° °Personal hygiene:  Used sanitary napkins for vaginal drainage not tampons. Shower or tub bathe the day after your procedure. No douching until bleeding stops. Always wipe from front to back after  Elimination. ° °Activity: Do not drive or operate any equipment today. The effects of the anesthesia are still present and drowsiness may result. Rest today, not necessarily flat bed rest, just take it easy. You may resume your normal activity in one to 2 days. ° °Sexual activity: No intercourse for one week or as indicated by your physician ° °Diet: Eat a light diet as desired this evening. You may resume a regular diet tomorrow. ° °Return to work: One to 2 days. ° °General Expectations of your surgery: Vaginal bleeding should be no heavier than a normal period. Spotting may continue up to 10 days. Mild cramps may continue for a couple of days. You may have a regular period in 2-6 weeks. ° °Unexpected observations call your doctor if these occur: persistent or heavy bleeding. Severe abdominal cramping or pain. Elevation of temperature greater than 100°F. ° °Call for an appointment in one week. ° ° ° °Patient's Signature_______________________________________________________ ° °Nurse's Signature________________________________________________________ ° ° °Post Anesthesia Home Care Instructions ° °Activity: °Get plenty of rest for the remainder of the day. A responsible adult should stay with you for 24 hours following the procedure.  °For the next 24 hours, DO NOT: °-Drive a car °-Operate machinery °-Drink alcoholic beverages °-Take any medication unless instructed by your physician °-Make any legal decisions or sign important papers. ° °Meals: °Start with liquid foods such as gelatin or soup. Progress to regular foods as tolerated. Avoid greasy, spicy, heavy foods. If nausea and/or vomiting occur, drink only clear liquids until the nausea and/or vomiting subsides. Call your physician  if vomiting continues. ° °Special Instructions/Symptoms: °Your throat may feel dry or sore from the anesthesia or the breathing tube placed in your throat during surgery. If this causes discomfort, gargle with warm salt water. The discomfort should disappear within 24 hours. ° °

## 2014-03-04 NOTE — Anesthesia Procedure Notes (Signed)
Procedure Name: LMA Insertion Date/Time: 03/04/2014 7:31 AM Performed by: Maris BergerENENNY, [name] [name]: Patient identified, Emergency Drugs available, Suction available and Patient being monitored Patient Re-evaluated:Patient Re-evaluated prior to inductionOxygen Delivery Method: Circle System Utilized Preoxygenation: Pre-oxygenation with 100% oxygen Intubation Type: IV induction Ventilation: Mask ventilation without difficulty LMA: LMA inserted LMA Size: 5.0 Number of attempts: 1 Airway Equipment and Method: bite block Placement Confirmation: positive ETCO2 Dental Injury: Teeth and Oropharynx as per pre-operative assessment

## 2014-03-04 NOTE — Anesthesia Preprocedure Evaluation (Signed)
Anesthesia Evaluation  Patient identified by MRN, date of birth, ID band Patient awake    Reviewed: Allergy & Precautions, H&P , NPO status   Airway Mallampati: III TM Distance: >3 FB Neck ROM: Full    Dental no notable dental hx. (+) Teeth Intact   Pulmonary neg pulmonary ROS,  breath sounds clear to auscultation  Pulmonary exam normal       Cardiovascular negative cardio ROS  Rhythm:Regular Rate:Normal     Neuro/Psych  Headaches, negative psych ROS   GI/Hepatic Neg liver ROS, GERD-  Medicated and Controlled,  Endo/Other  Morbid obesity  Renal/GU negative Renal ROS  negative genitourinary   Musculoskeletal negative musculoskeletal ROS (+)   Abdominal (+) + obese,   Peds  Hematology negative hematology ROS (+)   Anesthesia Other Findings   Reproductive/Obstetrics Menorrhagia Endometrial polyp                           Anesthesia Physical  Anesthesia Plan  ASA: III  Anesthesia Plan: General   Post-op Pain Management:    Induction: Intravenous  Airway Management Planned: LMA  Additional Equipment:   Intra-op Plan:   Post-operative Plan:   Informed Consent: I have reviewed the patients History and Physical, chart, labs and discussed the procedure including the risks, benefits and alternatives for the proposed anesthesia with the patient or authorized representative who has indicated his/her understanding and acceptance.   Dental advisory given  Plan Discussed with: Anesthesiologist, CRNA and Surgeon  Anesthesia Plan Comments:         Anesthesia Quick Evaluation

## 2014-03-04 NOTE — Transfer of Care (Signed)
Immediate Anesthesia Transfer of Care Note  Patient: [name] [name]  Procedure(s) Performed: Procedure(s): DILATATION & CURETTAGE/HYSTEROSCOPY WITH NOVASURE ABLATION (N/A)  Patient Location: PACU  Anesthesia Type:General  Level of Consciousness: awake  Airway & Oxygen Therapy: Patient Spontanous Breathing and Patient connected to nasal cannula oxygen  Post-op Assessment: Report given to PACU RN  Post vital signs: Reviewed and stable  Complications: No apparent anesthesia complications

## 2014-03-07 ENCOUNTER — Encounter (HOSPITAL_BASED_OUTPATIENT_CLINIC_OR_DEPARTMENT_OTHER): Payer: Self-pay | Admitting: Obstetrics and Gynecology

## 2014-03-28 ENCOUNTER — Encounter (HOSPITAL_BASED_OUTPATIENT_CLINIC_OR_DEPARTMENT_OTHER): Payer: Self-pay | Admitting: Obstetrics and Gynecology

## 2014-06-08 ENCOUNTER — Ambulatory Visit (INDEPENDENT_AMBULATORY_CARE_PROVIDER_SITE_OTHER): Payer: BC Managed Care – PPO | Admitting: Internal Medicine

## 2014-06-08 ENCOUNTER — Encounter: Payer: Self-pay | Admitting: Internal Medicine

## 2014-06-08 VITALS — BP 140/88 | HR 86 | Temp 98.8°F | Ht 64.0 in | Wt 269.8 lb

## 2014-06-08 DIAGNOSIS — R059 Cough, unspecified: Secondary | ICD-10-CM

## 2014-06-08 DIAGNOSIS — J310 Chronic rhinitis: Secondary | ICD-10-CM

## 2014-06-08 DIAGNOSIS — R05 Cough: Secondary | ICD-10-CM

## 2014-06-08 MED ORDER — AMOXICILLIN 500 MG PO CAPS: 500.0000 mg | ORAL_CAPSULE | Freq: Three times a day (TID) | ORAL | Status: DC

## 2014-06-08 MED ORDER — BENZONATATE 200 MG PO CAPS: 200.0000 mg | ORAL_CAPSULE | Freq: Three times a day (TID) | ORAL | Status: DC | PRN

## 2014-06-08 NOTE — Progress Notes (Signed)
   Subjective:    Patient ID: [name] SorensonMelissa R Austell, female    DOB: 04/24/1971, 44 y.o.   MRN: [id number]  HPI  Symptoms began 1/9 as sore throat, fatigue, headache and clear nasal drainage.  She's had associated head congestion and some obstruction of the right nasal passage. She's had frontal headache which has not been persistent.  She's used Mucinex and Zyrtec which helped sneezing associated with the symptoms. She had no other extrinsic symptoms.  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 Her aunt has been in the hospital with pneumonia.  She did not take the flu shot.  Review of Systems She denies persistent frontal sinus pain, facial pain, nasal purulence, dental pain, otic pain, otic discharge  She does not have itchy, watery eyes  The cough is not associated with wheezing or shortness of breath.  She has no fever, chills, or sweats.    Objective:   Physical Exam  Positive or pertinent findings include: The right nare is markedly dry with erythema. There is some localized exudate in this area.R sided obstruction to airflow.    General appearance:good health ;well nourished; no acute distress or increased work of breathing is present.  No  lymphadenopathy about the head, neck, or axilla noted.   Eyes: No conjunctival inflammation or lid edema is present. There is no scleral icterus.  Ears:  External ear exam shows no significant lesions or deformities.  Otoscopic examination reveals clear canals, tympanic membranes are intact bilaterally without bulging, retraction, inflammation or discharge.  Nose:  External nasal examination shows no deformity or inflammation.  Oral exam: Dental hygiene is good; lips and gums are healthy appearing.There is no oropharyngeal erythema or exudate noted.   Neck:  No deformities, thyromegaly, masses, or tenderness noted.   Supple with full range of motion without pain.   Heart:  Normal rate and regular rhythm. S1 and S2 normal  without gallop, murmur, click, rub or other extra sounds.   Lungs:Chest clear to auscultation; no wheezes, rhonchi,rales ,or rubs present.No increased work of breathing.    Extremities:  No cyanosis, edema, or clubbing  noted    Skin: Warm & dry w/o jaundice or tenting.       Assessment & Plan:  #1 non allergic rhinitis, probably viral induced #2 cough See Orders & AVS

## 2014-06-08 NOTE — Progress Notes (Signed)
Pre visit review using our clinic review tool, if applicable. No additional management support is needed unless otherwise documented below in the visit note. 

## 2014-06-08 NOTE — Patient Instructions (Signed)
Plain Mucinex (NOT D) for thick secretions ;force NON dairy fluids .   Nasal cleansing in the shower as discussed with lather of mild shampoo.After 10 seconds wash off lather while  exhaling through nostrils. Make sure that all residual soap is removed to prevent irritation.  Flonase OR Nasacort AQ 1 spray in each nostril twice a day as needed. Use the "crossover" technique into opposite nostril spraying toward opposite ear @ 45 degree angle, not straight up into nostril.  Plain Allegra (NOT D )  160 daily , Loratidine 10 mg , OR Zyrtec 10 mg @ bedtime  as needed for itchy eyes & sneezing.   Zicam Melts or Zinc lozenges as per package label for sore throat . Complementary options include  vitamin C 2000 mg daily; & Echinacea for 4-7 days. Report persistent or progressive fever; discolored nasal or chest secretions; or frontal headache or facial  pain.      Fill the  prescription for antibiotic it these symptoms appear in the next 72 hours.

## 2014-09-22 ENCOUNTER — Ambulatory Visit (INDEPENDENT_AMBULATORY_CARE_PROVIDER_SITE_OTHER): Payer: BC Managed Care – PPO | Admitting: Internal Medicine

## 2014-09-22 ENCOUNTER — Encounter: Payer: Self-pay | Admitting: Internal Medicine

## 2014-09-22 ENCOUNTER — Other Ambulatory Visit (INDEPENDENT_AMBULATORY_CARE_PROVIDER_SITE_OTHER): Payer: BC Managed Care – PPO

## 2014-09-22 VITALS — BP 122/88 | HR 84 | Temp 98.2°F | Resp 18 | Ht 64.0 in | Wt 274.1 lb

## 2014-09-22 DIAGNOSIS — R1013 Epigastric pain: Secondary | ICD-10-CM

## 2014-09-22 DIAGNOSIS — R51 Headache: Secondary | ICD-10-CM

## 2014-09-22 DIAGNOSIS — K219 Gastro-esophageal reflux disease without esophagitis: Secondary | ICD-10-CM | POA: Diagnosis not present

## 2014-09-22 DIAGNOSIS — R519 Headache, unspecified: Secondary | ICD-10-CM

## 2014-09-22 LAB — CBC WITH DIFFERENTIAL/PLATELET
Basophils Absolute: 0 10*3/uL (ref 0.0–0.1)
Basophils Relative: 0.4 % (ref 0.0–3.0)
EOS PCT: 2.1 % (ref 0.0–5.0)
Eosinophils Absolute: 0.1 10*3/uL (ref 0.0–0.7)
HEMATOCRIT: 37.4 % (ref 36.0–46.0)
Hemoglobin: 12.9 g/dL (ref 12.0–15.0)
Lymphocytes Relative: 29.5 % (ref 12.0–46.0)
Lymphs Abs: 1.8 10*3/uL (ref 0.7–4.0)
MCHC: 34.3 g/dL (ref 30.0–36.0)
MCV: 83.3 fl (ref 78.0–100.0)
MONOS PCT: 8.9 % (ref 3.0–12.0)
Monocytes Absolute: 0.6 10*3/uL (ref 0.1–1.0)
Neutro Abs: 3.7 10*3/uL (ref 1.4–7.7)
Neutrophils Relative %: 59.1 % (ref 43.0–77.0)
Platelets: 319 10*3/uL (ref 150.0–400.0)
RBC: 4.5 Mil/uL (ref 3.87–5.11)
RDW: 14.9 % (ref 11.5–15.5)
WBC: 6.2 10*3/uL (ref 4.0–10.5)

## 2014-09-22 LAB — H. PYLORI ANTIBODY, IGG: H Pylori IgG: NEGATIVE

## 2014-09-22 LAB — BASIC METABOLIC PANEL
BUN: 9 mg/dL (ref 6–23)
CHLORIDE: 103 meq/L (ref 96–112)
CO2: 27 meq/L (ref 19–32)
Calcium: 8.9 mg/dL (ref 8.4–10.5)
Creatinine, Ser: 0.75 mg/dL (ref 0.40–1.20)
GFR: 107.03 mL/min (ref >60.00)
GLUCOSE: 106 mg/dL — AB (ref 70–99)
Potassium: 3.8 mEq/L (ref 3.5–5.1)
Sodium: 136 mEq/L (ref 135–145)

## 2014-09-22 LAB — URINALYSIS, ROUTINE W REFLEX MICROSCOPIC
Bilirubin Urine: NEGATIVE
Ketones, ur: NEGATIVE
Leukocytes, UA: NEGATIVE
NITRITE: NEGATIVE
Total Protein, Urine: NEGATIVE
UROBILINOGEN UA: 1 (ref 0.0–1.0)
Urine Glucose: NEGATIVE
pH: 6 (ref 5.0–8.0)

## 2014-09-22 LAB — HEPATIC FUNCTION PANEL
ALT: 5 U/L (ref 0–35)
AST: 12 U/L (ref 0–37)
Albumin: 4 g/dL (ref 3.5–5.2)
Alkaline Phosphatase: 105 U/L (ref 39–117)
BILIRUBIN TOTAL: 0.2 mg/dL (ref 0.2–1.2)
Bilirubin, Direct: 0 mg/dL (ref 0.0–0.3)
Total Protein: 7 g/dL (ref 6.0–8.3)

## 2014-09-22 LAB — LIPASE: LIPASE: 18 U/L (ref 11.0–59.0)

## 2014-09-22 MED ORDER — SUMATRIPTAN SUCCINATE 100 MG PO TABS: 100.0000 mg | ORAL_TABLET | ORAL | Status: DC | PRN

## 2014-09-22 MED ORDER — ONDANSETRON HCL 4 MG PO TABS: ORAL_TABLET | ORAL | Status: DC

## 2014-09-22 MED ORDER — PANTOPRAZOLE SODIUM 40 MG PO TBEC: 40.0000 mg | DELAYED_RELEASE_TABLET | Freq: Every day | ORAL | Status: DC

## 2014-09-22 NOTE — Assessment & Plan Note (Signed)
Etiology unclear, exam with mild epigastric tender, VSS afeb, appears uncomfortable  - for protonix 40, antiemetic prn, labs as ordered including h pylori, and abd u/s - r/o GB, consider Gi referral for EGD if not improved

## 2014-09-22 NOTE — Patient Instructions (Signed)
Please take all new medication as prescribed - the protonix, and zofran for nausea as needed  Please continue all other medications as before, and refills have been done if requested.  Please have the pharmacy call with any other refills you may need.  Please keep your appointments with your specialists as you may have planned  You will be contacted regarding the referral for: ultrasound for the Gallbladder  Please go to the LAB in the Basement (turn left off the elevator) for the tests to be done today  You will be contacted by phone if any changes need to be made immediately.  Otherwise, you will receive a letter about your results with an explanation, but please check with MyChart first.  Please remember to sign up for MyChart if you have not done so, as this will be important to you in the future with finding out test results, communicating by private email, and scheduling acute appointments online when needed.

## 2014-09-22 NOTE — Assessment & Plan Note (Signed)
For PPI today,  to f/u any worsening symptoms or concerns

## 2014-09-22 NOTE — Progress Notes (Signed)
Pre visit review using our clinic review tool, if applicable. No additional management support is needed unless otherwise documented below in the visit note. 

## 2014-09-22 NOTE — Assessment & Plan Note (Signed)
Not migraine today, likely related to current illnes, no neuro change,  to f/u any worsening symptoms or concerns, did refill imitrex per pt request

## 2014-09-22 NOTE — Progress Notes (Signed)
Subjective:    Patient ID: [name] [name], female    DOB: 1971/01/05, 44 y.o.   MRN: [id number]  HPI  Here with nausea and epigsatric discomfort x 2-3 days, assoc with HA, feeling warm , no vomiting, felt warm to touch last night, but no vomit, no blood, no chills, but discomfort seems to radiate to the throat area, not better with ibuprofen and nexium otc, No real change in bowels.  Pain may be worse to eat, has less appetite though, feels bloated.  Did keep down saltines yesterday. No vomiting food or pills. No hx of GB or pancreatitis. Not pregnant Past Medical History  Diagnosis Date  . Headache(784.0)   . GERD (gastroesophageal reflux disease)   . Allergic rhinitis   . Anemia     for AUB  --  transfused 02-03-2014  . Wears contact lenses   . TMJ (temporomandibular joint syndrome)   . Abnormal uterine bleeding (AUB)    Past Surgical History  Procedure Laterality Date  . Cesarean section  10-21-2000  . Bunionectomy Left 2012  . Dilation and curettage of uterus  1999    with suction  . Dilation and curettage of uterus N/A 07/14/2013    Procedure: DILATATION AND CURETTAGE;  Surgeon: Meriel Picaichard M Holland, MD;  Location: [facility];  Service: Gynecology;  Laterality: N/A;  . Dilitation & currettage/hystroscopy with novasure ablation N/A 03/04/2014    Procedure: DILATATION & CURETTAGE/HYSTEROSCOPY WITH NOVASURE ABLATION;  Surgeon: Meriel Picaichard M Holland, MD;  [facility];  Service: Gynecology;  Laterality: N/A;    reports that she has never smoked. She has never used smokeless tobacco. She reports that she does not drink alcohol or use illicit drugs. family history includes Hypertension in her mother; Stroke in her father. Allergies  Allergen Reactions  . Codeine Nausea And Vomiting    Can tolerate when given phenergan   Current Outpatient Prescriptions on File Prior to Visit  Medication Sig Dispense Refill  . cetirizine (ZYRTEC) 10 MG tablet Take 10 mg by mouth as  needed for allergies.    Marland Kitchen. ibuprofen (ADVIL,MOTRIN) 200 MG tablet Take 600 mg by mouth every 6 (six) hours as needed for headache.    . ranitidine (ZANTAC) 150 MG tablet Take 150 mg by mouth every evening.      No current facility-administered medications on file prior to visit.   Review of Systems  Constitutional: Negative for unusual diaphoresis or night sweats HENT: Negative for ringing in ear or discharge Eyes: Negative for double vision or worsening visual disturbance.  Respiratory: Negative for choking and stridor.   Gastrointestinal: Negative for vomiting or other signifcant bowel change Genitourinary: Negative for hematuria or change in urine volume.  Musculoskeletal: Negative for other MSK pain or swelling Skin: Negative for color change and worsening wound.  Neurological: Negative for tremors and numbness other than noted  Psychiatric/Behavioral: Negative for decreased concentration or agitation other than above       Objective:   Physical Exam BP 122/88 mmHg  Pulse 84  Temp(Src) 98.2 F (36.8 C) (Oral)  Resp 18  Ht 5\' 4"  (1.626 m)  Wt 274 lb 1.3 oz (124.322 kg)  BMI 47.02 kg/m2  SpO2 96% VS noted,  Constitutional: Pt appears in no significant distress HENT: Head: NCAT.  Right Ear: External ear normal.  Left Ear: External ear normal.  Eyes: . Pupils are equal, round, and reactive to light. Conjunctivae and EOM are normal Neck: Normal range of motion. Neck supple.  Cardiovascular: Normal rate and regular rhythm.   Pulmonary/Chest: Effort normal and breath sounds without rales or wheezing.  Abd:  Soft, ND, + BS with mild epigastaric tender, no guarding or rebound Neurological: Pt is alert. Not confused , motor grossly intact Skin: Skin is warm. No rash, no LE edema Psychiatric: Pt behavior is normal. No agitation.     Assessment & Plan:

## 2014-10-05 ENCOUNTER — Other Ambulatory Visit: Payer: BC Managed Care – PPO

## 2014-10-16 ENCOUNTER — Other Ambulatory Visit: Payer: Self-pay | Admitting: Internal Medicine

## 2014-10-18 ENCOUNTER — Telehealth: Payer: Self-pay | Admitting: Internal Medicine

## 2014-10-18 MED ORDER — BENZONATATE 200 MG PO CAPS: ORAL_CAPSULE | ORAL | Status: DC

## 2014-10-18 NOTE — Telephone Encounter (Signed)
Patient has a bad cough and would Dr Sinclair GroomsPlotinkov he call something into her pharmacy [facility]  [facility] [address]

## 2014-10-18 NOTE — Telephone Encounter (Signed)
Ok [place] OV if not better Thx

## 2014-10-18 NOTE — Telephone Encounter (Signed)
Notified pt with md response.../lmb 

## 2014-10-20 ENCOUNTER — Ambulatory Visit (INDEPENDENT_AMBULATORY_CARE_PROVIDER_SITE_OTHER)
Admission: RE | Admit: 2014-10-20 | Discharge: 2014-10-20 | Disposition: A | Discharge status: Home / Self Care | Payer: BC Managed Care – PPO | Source: Ambulatory Visit | Attending: Internal Medicine | Admitting: Internal Medicine

## 2014-10-20 ENCOUNTER — Encounter: Payer: Self-pay | Admitting: Internal Medicine

## 2014-10-20 ENCOUNTER — Ambulatory Visit (INDEPENDENT_AMBULATORY_CARE_PROVIDER_SITE_OTHER): Payer: BC Managed Care – PPO | Admitting: Internal Medicine

## 2014-10-20 VITALS — BP 140/98 | HR 83 | Temp 98.9°F | Wt 271.0 lb

## 2014-10-20 DIAGNOSIS — G43919 Migraine, unspecified, intractable, without status migrainosus: Secondary | ICD-10-CM | POA: Diagnosis not present

## 2014-10-20 DIAGNOSIS — J45909 Unspecified asthma, uncomplicated: Secondary | ICD-10-CM | POA: Insufficient documentation

## 2014-10-20 DIAGNOSIS — K219 Gastro-esophageal reflux disease without esophagitis: Secondary | ICD-10-CM

## 2014-10-20 DIAGNOSIS — J4520 Mild intermittent asthma, uncomplicated: Secondary | ICD-10-CM

## 2014-10-20 MED ORDER — HYDROCOD POLST-CPM POLST ER 10-8 MG/5ML PO SUER: 5.0000 mL | Freq: Two times a day (BID) | ORAL | Status: DC | PRN

## 2014-10-20 MED ORDER — AZITHROMYCIN 250 MG PO TABS: ORAL_TABLET | ORAL | Status: DC

## 2014-10-20 MED ORDER — MOMETASONE FURO-FORMOTEROL FUM 200-5 MCG/ACT IN AERO: 2.0000 | INHALATION_SPRAY | Freq: Two times a day (BID) | RESPIRATORY_TRACT | Status: DC

## 2014-10-20 NOTE — Progress Notes (Signed)
   Subjective:    Cough This is a new problem. The current episode started in the past 7 days. The problem has been gradually worsening. The problem occurs constantly. The cough is productive of sputum. Associated symptoms include chest pain, a sore throat, shortness of breath and wheezing. Pertinent negatives include no rash.      Review of Systems  Constitutional: Positive for unexpected weight change. Negative for appetite change and fatigue.  HENT: Positive for sore throat.   Respiratory: Positive for cough, shortness of breath and wheezing.   Cardiovascular: Positive for chest pain.  Skin: Negative for rash.    Wt Readings from Last 3 Encounters:  10/20/14 271 lb (122.925 kg)  09/22/14 274 lb 1.3 oz (124.322 kg)  06/08/14 269 lb 12 oz (122.358 kg)   BP Readings from Last 3 Encounters:  10/20/14 140/98  09/22/14 122/88  06/08/14 140/88        Objective:   Physical Exam  Constitutional: She appears well-developed. No distress.  Obese  HENT:  Head: Normocephalic.  Right Ear: External ear normal.  Left Ear: External ear normal.  Nose: Nose normal.  Mouth/Throat: Oropharynx is clear and moist.  Eyes: Conjunctivae are normal. Pupils are equal, round, and reactive to light. Right eye exhibits no discharge. Left eye exhibits no discharge.  Neck: Normal range of motion. Neck supple. No JVD present. No tracheal deviation present. No thyromegaly present.  Cardiovascular: Normal rate, regular rhythm and normal heart sounds.   Pulmonary/Chest: No stridor. No respiratory distress. She has no wheezes.  Abdominal: Soft. Bowel sounds are normal. She exhibits no distension and no mass. There is no tenderness. There is no rebound and no guarding.  Musculoskeletal: She exhibits no edema or tenderness.  Lymphadenopathy:    She has no cervical adenopathy.  Neurological: She displays normal reflexes. No cranial nerve deficit. She exhibits normal muscle tone. Coordination normal.   Skin: No rash noted. No erythema.  Psychiatric: She has a normal mood and affect. Her behavior is normal. Judgment and thought content normal.  R prox clavicle is prominent, NT Barking cough        Assessment & Plan:

## 2014-10-20 NOTE — Assessment & Plan Note (Signed)
Acute CXR Tussionex prn Zpac

## 2014-10-20 NOTE — Progress Notes (Signed)
Pre visit review using our clinic review tool, if applicable. No additional management support is needed unless otherwise documented below in the visit note. 

## 2014-10-20 NOTE — Assessment & Plan Note (Signed)
Chronic  On Ranitidine 

## 2014-10-20 NOTE — Assessment & Plan Note (Signed)
Recurrent  Imitrex, Ibuprofen prn

## 2014-11-08 ENCOUNTER — Telehealth: Payer: Self-pay | Admitting: Internal Medicine

## 2014-11-08 MED ORDER — BENZONATATE 200 MG PO CAPS: ORAL_CAPSULE | ORAL | Status: DC

## 2014-11-08 NOTE — Telephone Encounter (Signed)
Pt called in said that she is not any better but could not take the cough meds that was given because it makes her sleepy.  Is there anything else that can be called in to help her with this cough.      Best number [phone]

## 2014-11-08 NOTE — Telephone Encounter (Signed)
Pt called in, I told pt the message below.  She said ok.

## 2014-11-08 NOTE — Telephone Encounter (Signed)
Ok [place] ref - done. Use OTC Delsum and Daytime Robutussin OV if not better w/any provider Thx

## 2014-12-12 ENCOUNTER — Telehealth: Payer: Self-pay | Admitting: Internal Medicine

## 2014-12-12 NOTE — Telephone Encounter (Signed)
Patient is being advised by the pharmacy that she cannot refill pantoprazole (PROTONIX) 40 MG tablet [[id number]][[id number]]  Without an authorization. I see no notes re: this. Please check on this and advise patient.

## 2014-12-13 NOTE — Telephone Encounter (Signed)
Pa initiated 12/13/14 via covermymeds. KEY: [id number]

## 2014-12-13 NOTE — Telephone Encounter (Signed)
Pa approved via covermymeds.

## 2014-12-14 ENCOUNTER — Telehealth: Payer: Self-pay | Admitting: Internal Medicine

## 2014-12-14 NOTE — Telephone Encounter (Signed)
Pt called in and is requesting a call back about results of her xrays that she has a couple of months ago?  She said that she never heard anything.  It was regarding the knots on her neck.  She is also inquiring about the PA on the Protonix?  What is the status?

## 2014-12-15 NOTE — Telephone Encounter (Signed)
Spoke with Pt. PA was approved. Pa stated she had xray done back in May and was under the impression they were going to look at a nodule located on neck? She never heard anything back? I did look it imaging and found chest xray and saw note about pneumonia. Pt wants nodule looked at. OV scheduled?

## 2014-12-21 NOTE — Telephone Encounter (Signed)
We x-rayed it (CXR) - it was ok Thx

## 2014-12-21 NOTE — Telephone Encounter (Signed)
Spoke with patient who advised that bump is located on the right side of her collar bone.

## 2014-12-21 NOTE — Telephone Encounter (Signed)
Was it in the regard to a bump on a collar bone? Thx

## 2014-12-21 NOTE — Telephone Encounter (Signed)
Called pt to get clarification on the location on bump.  If the pt does call back Front office: Is the location on her collar bone?

## 2014-12-22 NOTE — Telephone Encounter (Signed)
Patient notified via mychart massage.

## 2015-01-12 ENCOUNTER — Encounter: Payer: Self-pay | Admitting: Internal Medicine

## 2015-01-12 ENCOUNTER — Ambulatory Visit (INDEPENDENT_AMBULATORY_CARE_PROVIDER_SITE_OTHER): Payer: BC Managed Care – PPO | Admitting: Internal Medicine

## 2015-01-12 VITALS — BP 120/84 | HR 76 | Temp 98.5°F | Ht 64.0 in | Wt 276.0 lb

## 2015-01-12 DIAGNOSIS — M25511 Pain in right shoulder: Secondary | ICD-10-CM

## 2015-01-12 DIAGNOSIS — M5441 Lumbago with sciatica, right side: Secondary | ICD-10-CM

## 2015-01-12 DIAGNOSIS — M545 Low back pain, unspecified: Secondary | ICD-10-CM | POA: Insufficient documentation

## 2015-01-12 DIAGNOSIS — R1031 Right lower quadrant pain: Secondary | ICD-10-CM

## 2015-01-12 MED ORDER — CYCLOBENZAPRINE HCL 5 MG PO TABS: 5.0000 mg | ORAL_TABLET | Freq: Three times a day (TID) | ORAL | Status: DC | PRN

## 2015-01-12 MED ORDER — IBUPROFEN 800 MG PO TABS: 800.0000 mg | ORAL_TABLET | Freq: Three times a day (TID) | ORAL | Status: DC | PRN

## 2015-01-12 MED ORDER — PREDNISONE 10 MG PO TABS: ORAL_TABLET | ORAL | Status: DC
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
## 2015-01-12 NOTE — Progress Notes (Signed)
Pre visit review using our clinic review tool, if applicable. No additional management support is needed unless otherwise documented below in the visit note. 

## 2015-01-12 NOTE — Patient Instructions (Signed)
Please take all new medication as prescribed - the ibuprofen, muscle relaxer, and prednisone as prescribed  Please continue all other medications as before, and refills have been done if requested.  Please have the pharmacy call with any other refills you may need.  Please keep your appointments with your specialists as you may have planned

## 2015-01-15 DIAGNOSIS — R109 Unspecified abdominal pain: Secondary | ICD-10-CM | POA: Insufficient documentation
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
## 2015-01-15 NOTE — Assessment & Plan Note (Signed)
C/w strain vs sciatica, for ibuprofen, muscle relaxer, predpac asd,  to f/u any worsening symptoms or concerns, consider f/u with sport medicine for worsening
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
## 2015-02-01 ENCOUNTER — Encounter: Payer: Self-pay | Admitting: Internal Medicine

## 2015-02-01 ENCOUNTER — Ambulatory Visit (INDEPENDENT_AMBULATORY_CARE_PROVIDER_SITE_OTHER)
Admission: RE | Admit: 2015-02-01 | Discharge: 2015-02-01 | Disposition: A | Discharge status: Home / Self Care | Payer: BC Managed Care – PPO | Source: Ambulatory Visit | Attending: Internal Medicine | Admitting: Internal Medicine

## 2015-02-01 ENCOUNTER — Ambulatory Visit (INDEPENDENT_AMBULATORY_CARE_PROVIDER_SITE_OTHER): Payer: BC Managed Care – PPO | Admitting: Internal Medicine

## 2015-02-01 VITALS — BP 110/70 | HR 96 | Wt 277.0 lb

## 2015-02-01 DIAGNOSIS — M542 Cervicalgia: Secondary | ICD-10-CM | POA: Insufficient documentation

## 2015-02-01 DIAGNOSIS — R635 Abnormal weight gain: Secondary | ICD-10-CM | POA: Diagnosis not present

## 2015-02-01 DIAGNOSIS — M25519 Pain in unspecified shoulder: Secondary | ICD-10-CM | POA: Insufficient documentation

## 2015-02-01 DIAGNOSIS — M25511 Pain in right shoulder: Secondary | ICD-10-CM | POA: Diagnosis not present

## 2015-02-01 MED ORDER — VITAMIN D3 50 MCG (2000 UT) PO CAPS: 2000.0000 [IU] | ORAL_CAPSULE | Freq: Every day | ORAL | Status: DC

## 2015-02-01 MED ORDER — HYDROCODONE-ACETAMINOPHEN 5-325 MG PO TABS: 1.0000 | ORAL_TABLET | Freq: Two times a day (BID) | ORAL | Status: DC | PRN

## 2015-02-01 MED ORDER — CYCLOBENZAPRINE HCL ER 15 MG PO CP24: 15.0000 mg | ORAL_CAPSULE | Freq: Every day | ORAL | Status: DC | PRN

## 2015-02-01 MED ORDER — ONDANSETRON HCL 4 MG PO TABS: ORAL_TABLET | ORAL | Status: AC

## 2015-02-01 MED ORDER — MELOXICAM 15 MG PO TABS: 15.0000 mg | ORAL_TABLET | Freq: Every day | ORAL | Status: DC | PRN
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
## 2015-02-01 NOTE — Progress Notes (Signed)
Pre visit review using our clinic review tool, if applicable. No additional management support is needed unless otherwise documented below in the visit note. 
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
## 2015-02-02 ENCOUNTER — Encounter: Payer: Self-pay | Admitting: Internal Medicine

## 2015-02-02 NOTE — Assessment & Plan Note (Signed)
Discussed.

## 2015-09-28 ENCOUNTER — Encounter: Payer: Self-pay | Admitting: Family

## 2015-09-28 ENCOUNTER — Ambulatory Visit (INDEPENDENT_AMBULATORY_CARE_PROVIDER_SITE_OTHER): Payer: BC Managed Care – PPO | Admitting: Family

## 2015-09-28 VITALS — BP 150/90 | HR 62 | Temp 98.0°F | Ht 64.0 in | Wt 274.8 lb

## 2015-09-28 DIAGNOSIS — J309 Allergic rhinitis, unspecified: Secondary | ICD-10-CM | POA: Diagnosis not present

## 2015-09-28 MED ORDER — FLUTICASONE PROPIONATE 50 MCG/ACT NA SUSP: 2.0000 | Freq: Every day | NASAL | Status: AC

## 2015-09-28 MED ORDER — MOMETASONE FURO-FORMOTEROL FUM 200-5 MCG/ACT IN AERO: 2.0000 | INHALATION_SPRAY | Freq: Two times a day (BID) | RESPIRATORY_TRACT | Status: AC

## 2015-09-28 MED ORDER — GUAIFENESIN ER 600 MG PO TB12: 1200.0000 mg | ORAL_TABLET | Freq: Two times a day (BID) | ORAL | Status: DC | PRN

## 2015-09-28 NOTE — Progress Notes (Signed)
Pre visit review using our clinic review tool, if applicable. No additional management support is needed unless otherwise documented below in the visit note. 

## 2015-09-28 NOTE — Progress Notes (Signed)
Subjective:    Patient ID: [name] SorensonMelissa R Frisch, female    DOB: January 16, 1971, 45 y.o.   MRN: [id number]   [name] SorensonMelissa R Ansell is a 45 y.o. female who presents today for an acute visit.    HPI Comments: Patient here for evaluation of sinus congestion  for more than a month, worsening. Ringing in the ear and clogged left ear has been persistent for fhe last 2-3 weeks. Left eye has been watering for past two days. Endorses dizziness which started about a week about and is causing nausea, no vomiting. Dizziness self limiting. Reports some vertigo. No episodes of passing out.  Endorses HA, and pressure behind the eyes. Has been using Afrin with some relief.   Seasonal allergies; take zyrtec. Patient has stable asthma, requested refill of inhaler.    Past Medical History  Diagnosis Date  . Headache(784.0)   . GERD (gastroesophageal reflux disease)   . Allergic rhinitis   . Anemia     for AUB  --  transfused 02-03-2014  . Wears contact lenses   . TMJ (temporomandibular joint syndrome)   . Abnormal uterine bleeding (AUB)    Allergies: Codeine Current Outpatient Prescriptions on File Prior to Visit  Medication Sig Dispense Refill  . cetirizine (ZYRTEC) 10 MG tablet Take 10 mg by mouth as needed for allergies.    . Cholecalciferol (VITAMIN D3) 2000 UNITS capsule Take 1 capsule (2,000 Units total) by mouth daily. 100 capsule 3  . ondansetron (ZOFRAN) 4 MG tablet 1 tab by mouthe very 6 hrs as needed for nausea 30 tablet 1  . pantoprazole (PROTONIX) 40 MG tablet Take 1 tablet (40 mg total) by mouth daily. 90 tablet 3  . SUMAtriptan (IMITREX) 100 MG tablet Take 1 tablet (100 mg total) by mouth every 2 (two) hours as needed for migraine. (Patient not taking: Reported on 09/28/2015) 10 tablet 3   No current facility-administered medications on file prior to visit.    Social History  Substance Use Topics  . Smoking status: Never Smoker   . Smokeless tobacco: Never Used  . Alcohol Use: No     Comment:  occasional    Review of Systems  Constitutional: Negative for fever, chills and unexpected weight change.  HENT: Positive for congestion and sinus pressure. Negative for ear discharge, ear pain and sore throat.   Eyes: Negative for visual disturbance.  Respiratory: Negative for cough, shortness of breath and wheezing.   Cardiovascular: Negative for chest pain, palpitations and leg swelling.  Gastrointestinal: Positive for nausea. Negative for vomiting.  Musculoskeletal: Negative for myalgias and arthralgias.  Skin: Negative for rash.  Neurological: Positive for dizziness and headaches ('pressure'). Negative for syncope.  Hematological: Negative for adenopathy.      Objective:    BP 150/90 mmHg  Pulse 62  Temp(Src) 98 F (36.7 C) (Oral)  Ht 5\' 4"  (1.626 m)  Wt 274 lb 12 oz (124.626 kg)  BMI 47.14 kg/m2  SpO2 97%   Physical Exam  Constitutional: She appears well-developed and well-nourished.  HENT:  Head: Normocephalic and atraumatic.  Right Ear: Hearing, tympanic membrane, external ear and ear canal normal. No drainage, swelling or tenderness. No foreign bodies. Tympanic membrane is not erythematous and not bulging. No middle ear effusion. No decreased hearing is noted.  Left Ear: Hearing, tympanic membrane, external ear and ear canal normal. No drainage, swelling or tenderness. No foreign bodies. Tympanic membrane is not erythematous and not bulging.  No middle ear effusion. No decreased hearing is  noted.  Nose: Rhinorrhea and nose lacerations present. No epistaxis. Right sinus exhibits no maxillary sinus tenderness and no frontal sinus tenderness. Left sinus exhibits no maxillary sinus tenderness and no frontal sinus tenderness.  Mouth/Throat: Uvula is midline, oropharynx is clear and moist and mucous membranes are normal. No oropharyngeal exudate, posterior oropharyngeal edema, posterior oropharyngeal erythema or tonsillar abscesses.  Mucosa of nose very erythematous; macerated.  No epistaxis.   Cardiovascular: Regular rhythm, normal heart sounds and normal pulses.   Pulmonary/Chest: Effort normal and breath sounds normal. She has no wheezes. She has no rhonchi. She has no rales.  Lymphadenopathy:       Head (right side): No submental, no submandibular, no tonsillar, no preauricular, no posterior auricular and no occipital adenopathy present.       Head (left side): No submental, no submandibular, no tonsillar, no preauricular, no posterior auricular and no occipital adenopathy present.    She has no cervical adenopathy.  Neurological: She is alert.  Skin: Skin is warm and dry.  Psychiatric: She has a normal mood and affect. Her speech is normal and behavior is normal. Thought content normal.  Vitals reviewed.      Assessment & Plan:  1. Allergic rhinitis, unspecified allergic rhinitis type Trial of flonase, mucinex for sinus congestion, dizziness, and ear complaints. No evidence of acute bacterial infection at this time. Stop Afrin.   - mometasone-formoterol (DULERA) 200-5 MCG/ACT AERO; Inhale 2 puffs into the lungs 2 (two) times daily.  Dispense: 3 Inhaler; Refill: 1 ; refilled for asthma - fluticasone (FLONASE) 50 MCG/ACT nasal spray; Place 2 sprays into both nostrils daily.  Dispense: 16 g; Refill: 2 - guaiFENesin (MUCINEX) 600 MG 12 hr tablet; Take 2 tablets (1,200 mg total) by mouth 2 (two) times daily as needed for cough or to loosen phlegm.  Dispense: 60 tablet; Refill: 1   I have discontinued Ms. Rumsey's ranitidine, cyclobenzaprine, HYDROcodone-acetaminophen, and meloxicam. I am also having her start on fluticasone and guaiFENesin. Additionally, I am having her maintain her cetirizine, SUMAtriptan, pantoprazole, ondansetron, Vitamin D3, and mometasone-formoterol.   Meds ordered this encounter  Medications  . mometasone-formoterol (DULERA) 200-5 MCG/ACT AERO    Sig: Inhale 2 puffs into the lungs 2 (two) times daily.    Dispense:  3 Inhaler    Refill:   1  . fluticasone (FLONASE) 50 MCG/ACT nasal spray    Sig: Place 2 sprays into both nostrils daily.    Dispense:  16 g    Refill:  2    Order Specific Question:  Supervising Provider    Answer:  Tresa Garter [[id number]]  . guaiFENesin (MUCINEX) 600 MG 12 hr tablet    Sig: Take 2 tablets (1,200 mg total) by mouth 2 (two) times daily as needed for cough or to loosen phlegm.    Dispense:  60 tablet    Refill:  1    Order Specific Question:  Supervising Provider    Answer:  Tresa Garter [[id number]]     Start medications as prescribed and explained to patient on After Visit Summary ( AVS). Risks, benefits, and alternatives of the medications and treatment plan prescribed today were discussed, and patient expressed understanding.   Education regarding symptom management and diagnosis given to patient.   Follow-up:Plan follow-up as discussed or as needed if any worsening symptoms or change in condition. No Follow-up on file.   Continue to follow with Sonda Primes, MD for routine health maintenance.   [name] [name] and I  agreed with plan.   Mable Paris, FNP

## 2015-09-28 NOTE — Patient Instructions (Signed)
Stop Affrin. Use flonase- this should help with congestion and dizziness.   Increase intake of clear fluids. Congestion is best treated by hydration, when mucus is wetter, it is thinner, less sticky, and easier to expel from the body, either through coughing up drainage, or by blowing your nose.   Get plenty of rest.   Use saline nasal drops and blow your nose frequently. Run a humidifier at night and elevate the head of the bed. Vicks Vapor rub will help with congestion and cough. Steam showers and sinus massage for congestion.   Use Acetaminophen or Ibuprofen as needed for fever or pain. Avoid second hand smoke. Even the smallest exposure will worsen symptoms.   Over the counter medications you can try include Delsym for cough, a decongestant for congestion, and Mucinex or Robitussin as an expectorant. Be sure to just get the plain Mucinex or Robitussin that just has one medication (Guaifenesen). We don't recommend the combination products. Note, be sure to drink two glasses of water with each dose of Mucinex as the medication will not work well without adequate hydration.   You can also try a teaspoon of honey to see if this will help reduce cough. Throat lozenges can sometimes be beneficial as well.    This illness will typically last 7 - 10 days.   Please follow up with our clinic if you develop a fever greater than 101 F, symptoms worsen, or do not resolve in the next week.

## 2015-09-29 ENCOUNTER — Ambulatory Visit: Payer: BC Managed Care – PPO | Admitting: Internal Medicine

## 2015-10-02 ENCOUNTER — Telehealth: Payer: Self-pay

## 2015-10-02 NOTE — Telephone Encounter (Signed)
Patient states the medication she was on was not helping and she really wants and abx. She saw Dr. Linford ArnoldArrnett on Thursday. Still not feeling well at all. Please advise or follow up

## 2015-10-03 MED ORDER — CEFDINIR 300 MG PO CAPS: 300.0000 mg | ORAL_CAPSULE | Freq: Two times a day (BID) | ORAL | Status: DC

## 2015-10-03 NOTE — Telephone Encounter (Signed)
Pt aware.

## 2015-10-03 NOTE — Telephone Encounter (Signed)
Ok Omnicef 300 mg qd x 10 d (Rx emailed) OV if not better Thx

## 2015-10-03 NOTE — Telephone Encounter (Signed)
Pt called to check up on this request. Please call pt °

## 2015-10-04 ENCOUNTER — Other Ambulatory Visit: Payer: Self-pay | Admitting: Internal Medicine

## 2015-11-08 ENCOUNTER — Other Ambulatory Visit: Payer: Self-pay | Admitting: Internal Medicine

## 2015-11-14 ENCOUNTER — Encounter: Payer: Self-pay | Admitting: Internal Medicine

## 2015-11-14 ENCOUNTER — Ambulatory Visit (INDEPENDENT_AMBULATORY_CARE_PROVIDER_SITE_OTHER): Payer: BC Managed Care – PPO | Admitting: Internal Medicine

## 2015-11-14 ENCOUNTER — Other Ambulatory Visit (INDEPENDENT_AMBULATORY_CARE_PROVIDER_SITE_OTHER): Payer: BC Managed Care – PPO

## 2015-11-14 ENCOUNTER — Ambulatory Visit (INDEPENDENT_AMBULATORY_CARE_PROVIDER_SITE_OTHER)
Admission: RE | Admit: 2015-11-14 | Discharge: 2015-11-14 | Disposition: A | Discharge status: Home / Self Care | Payer: BC Managed Care – PPO | Source: Ambulatory Visit | Attending: Internal Medicine | Admitting: Internal Medicine

## 2015-11-14 VITALS — BP 100/72 | HR 96 | Wt 272.0 lb

## 2015-11-14 DIAGNOSIS — M25572 Pain in left ankle and joints of left foot: Secondary | ICD-10-CM

## 2015-11-14 DIAGNOSIS — K219 Gastro-esophageal reflux disease without esophagitis: Secondary | ICD-10-CM

## 2015-11-14 DIAGNOSIS — M25579 Pain in unspecified ankle and joints of unspecified foot: Secondary | ICD-10-CM | POA: Insufficient documentation

## 2015-11-14 LAB — CBC WITH DIFFERENTIAL/PLATELET
BASOS ABS: 0 10*3/uL (ref 0.0–0.1)
BASOS PCT: 0.5 % (ref 0.0–3.0)
EOS ABS: 0.1 10*3/uL (ref 0.0–0.7)
Eosinophils Relative: 1.4 % (ref 0.0–5.0)
HCT: 39.2 % (ref 36.0–46.0)
HEMOGLOBIN: 13.2 g/dL (ref 12.0–15.0)
LYMPHS PCT: 29.6 % (ref 12.0–46.0)
Lymphs Abs: 2.4 10*3/uL (ref 0.7–4.0)
MCHC: 33.8 g/dL (ref 30.0–36.0)
MCV: 86.8 fl (ref 78.0–100.0)
MONO ABS: 0.6 10*3/uL (ref 0.1–1.0)
Monocytes Relative: 6.9 % (ref 3.0–12.0)
Neutro Abs: 5.1 10*3/uL (ref 1.4–7.7)
Neutrophils Relative %: 61.6 % (ref 43.0–77.0)
Platelets: 352 10*3/uL (ref 150.0–400.0)
RBC: 4.51 Mil/uL (ref 3.87–5.11)
RDW: 13.7 % (ref 11.5–15.5)
WBC: 8.3 10*3/uL (ref 4.0–10.5)

## 2015-11-14 LAB — BASIC METABOLIC PANEL
BUN: 13 mg/dL (ref 6–23)
CHLORIDE: 101 meq/L (ref 96–112)
CO2: 29 mEq/L (ref 19–32)
Calcium: 9.9 mg/dL (ref 8.4–10.5)
Creatinine, Ser: 0.94 mg/dL (ref 0.40–1.20)
GFR: 82.07 mL/min (ref >60.00)
GLUCOSE: 103 mg/dL — AB (ref 70–99)
POTASSIUM: 4.7 meq/L (ref 3.5–5.1)
SODIUM: 136 meq/L (ref 135–145)

## 2015-11-14 LAB — HEPATIC FUNCTION PANEL
ALBUMIN: 4.2 g/dL (ref 3.5–5.2)
ALK PHOS: 94 U/L (ref 39–117)
ALT: 7 U/L (ref 0–35)
AST: 15 U/L (ref 0–37)
Bilirubin, Direct: 0.1 mg/dL (ref 0.0–0.3)
Total Bilirubin: 0.4 mg/dL (ref 0.2–1.2)
Total Protein: 7.7 g/dL (ref 6.0–8.3)

## 2015-11-14 LAB — URIC ACID: URIC ACID, SERUM: 6.3 mg/dL (ref 2.4–7.0)

## 2015-11-14 MED ORDER — TRAMADOL HCL 50 MG PO TABS: 50.0000 mg | ORAL_TABLET | Freq: Two times a day (BID) | ORAL | Status: DC | PRN

## 2015-11-14 MED ORDER — PREDNISONE 10 MG PO TABS: ORAL_TABLET | ORAL | Status: DC

## 2015-11-14 MED ORDER — RANITIDINE HCL 150 MG PO TABS: 150.0000 mg | ORAL_TABLET | Freq: Two times a day (BID) | ORAL | Status: DC

## 2015-11-14 MED ORDER — COLCHICINE 0.6 MG PO TABS: 0.6000 mg | ORAL_TABLET | Freq: Two times a day (BID) | ORAL | Status: DC
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
## 2015-11-14 NOTE — Progress Notes (Signed)
Pre visit review using our clinic review tool, if applicable. No additional management support is needed unless otherwise documented below in the visit note. 

## 2015-11-14 NOTE — Patient Instructions (Signed)

## 2015-11-14 NOTE — Assessment & Plan Note (Signed)
L ankle pain - r/o gout/pseudogout - started after her trip to [place] Prednisone 10 mg: take 4 tabs a day x 3 days; then 3 tabs a day x 4 days; then 2 tabs a day x 4 days, then 1 tab a day x 6 days, then stop. Take pc.  Potential benefits of a short term steroid  use as well as potential risks  and complications were explained to the patient and were aknowledged. Tramadol prn  Potential benefits of  opioids use as well as potential risks (i.e. addiction risk, apnea etc) and complications (i.e. Somnolence, constipation and others) were explained to the patient and were aknowledged. ACE wrap Labs

## 2015-11-14 NOTE — Assessment & Plan Note (Signed)
Worse due to NSAIDs Added Ranitidine

## 2015-11-15 ENCOUNTER — Encounter: Payer: Self-pay | Admitting: Internal Medicine

## 2015-11-16 NOTE — Telephone Encounter (Signed)
Done. Letter upfront for p/u. Pt informed via MyChart message.

## 2015-12-31 ENCOUNTER — Other Ambulatory Visit: Payer: Self-pay | Admitting: Internal Medicine

## 2016-01-31 ENCOUNTER — Other Ambulatory Visit: Payer: Self-pay | Admitting: Internal Medicine

## 2016-03-06 ENCOUNTER — Ambulatory Visit (INDEPENDENT_AMBULATORY_CARE_PROVIDER_SITE_OTHER): Payer: BC Managed Care – PPO | Admitting: Adult Health

## 2016-03-06 ENCOUNTER — Encounter: Payer: Self-pay | Admitting: Adult Health

## 2016-03-06 VITALS — BP 120/82 | Temp 98.2°F | Ht 64.0 in | Wt 278.5 lb

## 2016-03-06 DIAGNOSIS — M5412 Radiculopathy, cervical region: Secondary | ICD-10-CM | POA: Diagnosis not present

## 2016-03-06 MED ORDER — COLCHICINE 0.6 MG PO TABS: 0.6000 mg | ORAL_TABLET | Freq: Two times a day (BID) | ORAL | 0 refills | Status: DC

## 2016-03-06 MED ORDER — TIZANIDINE HCL 4 MG PO CAPS: 4.0000 mg | ORAL_CAPSULE | Freq: Three times a day (TID) | ORAL | 0 refills | Status: DC

## 2016-03-06 MED ORDER — METHYLPREDNISOLONE 4 MG PO TBPK: ORAL_TABLET | ORAL | 0 refills | Status: DC

## 2016-03-06 NOTE — Progress Notes (Signed)
Subjective:    Patient ID: [name] [name], female    DOB: 1970-08-15, 45 y.o.   MRN: [id number]  HPI  45 year old female who presents to the office today for the complaint of one day of numbness and tingling in her right arm. She reports that last night around 8 pm she noticed that her vision was getting blurry and her right arm became numb. As the night progressed the numbness and tingling in her right arm became worse. Most of the blurred vision is in the right eye. She denies any dizziness but does have some lightheadedness. She is also complaining of a headache that started last night. She reports that she took ibuprofen last night and this morning which helped the headache but the headache continues.   She also feels as though she has a loss of grip strength in her right hand  She denies any slurred speech or facial droop    Review of Systems  Constitutional: Negative.   Eyes: Negative.   Respiratory: Negative.   Cardiovascular: Negative.   Neurological: Positive for light-headedness, numbness and headaches. Negative for dizziness, tremors, seizures, syncope, facial asymmetry, speech difficulty and weakness.  All other systems reviewed and are negative.  Past Medical History:  Diagnosis Date  . Abnormal uterine bleeding (AUB)   . Allergic rhinitis   . Anemia    for AUB  --  transfused 02-03-2014  . GERD (gastroesophageal reflux disease)   . Headache(784.0)   . TMJ (temporomandibular joint syndrome)   . Wears contact lenses     Social History   Social History  . Marital status: Married    Spouse name: N/A  . Number of children: N/A  . Years of education: N/A   Occupational History  . Not on file.   Social History Main Topics  . Smoking status: Never Smoker  . Smokeless tobacco: Never Used  . Alcohol use No     Comment: occasional  . Drug use: No  . Sexual activity: Not on file   Other Topics Concern  . Not on file   Social History Narrative  . No  narrative on file    Past Surgical History:  Procedure Laterality Date  . BUNIONECTOMY Left 2012  . CESAREAN SECTION  10-21-2000  . DILATION AND CURETTAGE OF UTERUS  1999   with suction  . DILATION AND CURETTAGE OF UTERUS N/A 07/14/2013   Procedure: DILATATION AND CURETTAGE;  Surgeon: [name], MD;  Location: [facility];  Service: Gynecology;  Laterality: N/A;  . DILITATION & CURRETTAGE/HYSTROSCOPY WITH NOVASURE ABLATION N/A 03/04/2014   Procedure: DILATATION & CURETTAGE/HYSTEROSCOPY WITH NOVASURE ABLATION;  Surgeon: [name], MD;  Location: [facility];  Service: Gynecology;  Laterality: N/A;    Family History  Problem Relation Age of Onset  . Hypertension Mother   . Stroke Father     Allergies  Allergen Reactions  . Codeine Nausea And Vomiting    Can tolerate when given phenergan    Current Outpatient Prescriptions on File Prior to Visit  Medication Sig Dispense Refill  . cetirizine (ZYRTEC) 10 MG tablet Take 10 mg by mouth as needed for allergies.    . Cholecalciferol (VITAMIN D3) 2000 UNITS capsule Take 1 capsule (2,000 Units total) by mouth daily. 100 capsule 3  . fluticasone (FLONASE) 50 MCG/ACT nasal spray Place 2 sprays into both nostrils daily. 16 g 2  . guaiFENesin (MUCINEX) 600 MG 12 hr tablet Take 2 tablets (1,200  mg total) by mouth 2 (two) times daily as needed for cough or to loosen phlegm. 60 tablet 1  . mometasone-formoterol (DULERA) 200-5 MCG/ACT AERO Inhale 2 puffs into the lungs 2 (two) times daily. 3 Inhaler 1  . ondansetron (ZOFRAN) 4 MG tablet 1 tab by mouthe very 6 hrs as needed for nausea 30 tablet 1  . pantoprazole (PROTONIX) 40 MG tablet Take 1 tablet (40 mg total) by mouth daily. 90 tablet 2  . predniSONE (DELTASONE) 10 MG tablet Prednisone 10 mg: take 4 tabs a day x 3 days; then 3 tabs a day x 4 days; then 2 tabs a day x 4 days, then 1 tab a day x 6 days, then stop. Take pc. 38 tablet 1  . ranitidine (ZANTAC) 150 MG tablet  TAKE 1 TABLET BY MOUTH TWICE A DAY 60 tablet 1  . SUMAtriptan (IMITREX) 100 MG tablet TAKE 1 TABLET BY MOUTH EVERY 2 HOURS AS NEEDED FOR MIGRAINE 10 tablet 0  . traMADol (ULTRAM) 50 MG tablet Take 1-2 tablets (50-100 mg total) by mouth 2 (two) times daily as needed. 60 tablet 0   No current facility-administered medications on file prior to visit.     BP 120/82   Temp 98.2 F (36.8 C) (Oral)   Ht 5\' 4"  (1.626 m)   Wt 278 lb 8 oz (126.3 kg)   BMI 47.80 kg/m       Objective:   Physical Exam  Constitutional: She is oriented to person, place, and time. She appears well-developed and well-nourished. No distress.  HENT:  Head: Normocephalic and atraumatic.  Right Ear: External ear normal.  Left Ear: External ear normal.  Nose: Nose normal.  Mouth/Throat: Oropharynx is clear and moist. No oropharyngeal exudate.  Eyes: Conjunctivae and EOM are normal. Pupils are equal, round, and reactive to light. Right eye exhibits no discharge. Left eye exhibits no discharge. No scleral icterus.  Neck: Normal range of motion. Neck supple. No thyromegaly present.  Cardiovascular: Normal rate, regular rhythm, normal heart sounds and intact distal pulses.  Exam reveals no gallop and no friction rub.   No murmur heard. Pulmonary/Chest: Effort normal and breath sounds normal. No respiratory distress. She has no wheezes. She has no rales. She exhibits no tenderness.  Musculoskeletal: Normal range of motion. She exhibits no edema, tenderness or deformity.  Denies any pain in the right shoulder. Has full ROM. Good cap refill.   Lymphadenopathy:    She has no cervical adenopathy.  Neurological: She is alert and oriented to person, place, and time. She has normal reflexes. She displays normal reflexes. No cranial nerve deficit. She exhibits normal muscle tone. Coordination normal.  Decreased grip strength in right hand. Decreased strength against resistance. No pronator drift.   Skin: Skin is warm and dry. No  rash noted. She is not diaphoretic. No erythema. No pallor.  Psychiatric: She has a normal mood and affect. Her behavior is normal. Judgment and thought content normal.  Nursing note and vitals reviewed.     Assessment & Plan:  1. Cervical radiculitis - Exam consistent with cervical radiculitis. She is young and relatively healthy, suspicion for CVA is very low - methylPREDNISolone (MEDROL DOSEPAK) 4 MG TBPK tablet; Take as directed  Dispense: 21 tablet; Refill: 0 - tiZANidine (ZANAFLEX) 4 MG capsule; Take 1 capsule (4 mg total) by mouth 3 (three) times daily.  Dispense: 30 capsule; Refill: 0 - Heating pad - Tylenol or Motrin  - Follow up if no improvement -  Go to the ER with worsening symptoms   Shirline Freesory Daiana Vitiello, NP

## 2016-03-06 NOTE — Patient Instructions (Addendum)
It was great meeting you.   Your exam is consistent with a pinched nerve.   I have sent in a prescription for prednisone and a muscle relaxer. Please take these as directed.   Follow up with your PCP if no improvement.    Cervical Radiculopathy Cervical radiculopathy happens when a nerve in the neck (cervical nerve) is pinched or bruised. This condition can develop because of an injury or as part of the normal aging process. Pressure on the cervical nerves can cause pain or numbness that runs from the neck all the way down into the arm and fingers. Usually, this condition gets better with rest. Treatment may be needed if the condition does not improve.  CAUSES This condition may be caused by:  Injury.  Slipped (herniated) disk.  Muscle tightness in the neck because of overuse.  Arthritis.  Breakdown or degeneration in the bones and joints of the spine (spondylosis) due to aging.  Bone spurs that may develop near the cervical nerves. SYMPTOMS Symptoms of this condition include:  Pain that runs from the neck to the arm and hand. The pain can be severe or irritating. It may be worse when the neck is moved.  Numbness or weakness in the affected arm and hand. DIAGNOSIS This condition may be diagnosed based on symptoms, medical history, and a physical exam. You may also have tests, including:  X-rays.  CT scan.  MRI.  Electromyogram (EMG).  Nerve conduction tests. TREATMENT In many cases, treatment is not needed for this condition. With rest, the condition usually gets better over time. If treatment is needed, options may include:  Wearing a soft neck collar for short periods of time.  Physical therapy to strengthen your neck muscles.  Medicines, such as NSAIDs, oral corticosteroids, or spinal injections.  Surgery. This may be needed if other treatments do not help. Various types of surgery may be done depending on the cause of your problems. HOME CARE  INSTRUCTIONS Managing Pain  Take over-the-counter and prescription medicines only as told by your health care provider.  If directed, apply ice to the affected area.  Put ice in a plastic bag.  Place a towel between your skin and the bag.  Leave the ice on for 20 minutes, 2-3 times per day.  If ice does not help, you can try using heat. Take a warm shower or warm bath, or use a heat pack as told by your health care provider.  Try a gentle neck and shoulder massage to help relieve symptoms. Activity  Rest as needed. Follow instructions from your health care provider about any restrictions on activities.  Do stretching and strengthening exercises as told by your health care provider or physical therapist. General Instructions  If you were given a soft collar, wear it as told by your health care provider.  Use a flat pillow when you sleep.  Keep all follow-up visits as told by your health care provider. This is important. SEEK MEDICAL CARE IF:  Your condition does not improve with treatment. SEEK IMMEDIATE MEDICAL CARE IF:  Your pain gets much worse and cannot be controlled with medicines.  You have weakness or numbness in your hand, arm, face, or leg.  You have a high fever.  You have a stiff, rigid neck.  You lose control of your bowels or your bladder (have incontinence).  You have trouble with walking, balance, or speaking.   This information is not intended to replace advice given to you by your health  care provider. Make sure you discuss any questions you have with your health care provider.   Document Released: 02/05/2001 Document Revised: 02/01/2015 Document Reviewed: 07/07/2014 Elsevier Interactive Patient Education [employer].

## 2016-03-19 ENCOUNTER — Ambulatory Visit
Admission: RE | Admit: 2016-03-19 | Discharge: 2016-03-19 | Disposition: A | Discharge status: Home / Self Care | Payer: BC Managed Care – PPO | Source: Ambulatory Visit | Attending: Obstetrics and Gynecology | Admitting: Obstetrics and Gynecology

## 2016-03-19 ENCOUNTER — Other Ambulatory Visit: Payer: Self-pay | Admitting: Obstetrics and Gynecology

## 2016-03-19 DIAGNOSIS — N6002 Solitary cyst of left breast: Secondary | ICD-10-CM

## 2016-03-20 ENCOUNTER — Other Ambulatory Visit: Payer: Self-pay | Admitting: *Deleted

## 2016-03-20 MED ORDER — RANITIDINE HCL 150 MG PO TABS: 150.0000 mg | ORAL_TABLET | Freq: Two times a day (BID) | ORAL | 1 refills | Status: DC

## 2016-03-26 ENCOUNTER — Encounter: Payer: Self-pay | Admitting: Internal Medicine

## 2016-03-27 ENCOUNTER — Telehealth: Payer: Self-pay | Admitting: *Deleted

## 2016-03-27 NOTE — Telephone Encounter (Signed)
Form given to PCP for signature/completion.     Misty StanleyStacey,   OK a temp form for 6 months  Thx    ----- Message -----  From: Anselm Junglingahlia M Bonyun-Debourgh, CMA  Sent: 03/26/2016 11:48 AM  To: Tresa GarterAleksei V Plotnikov, MD  Subject: FW: Non-Urgent Medical Question               ----- Message -----  From: Laverda SorensonMelissa R Terwilliger  Sent: 03/26/2016 11:47 AM  To: [facility] Clinical Pool  Subject: Non-Urgent Medical Question             My left ankle swells and is painful to walk at times with the gout, whether the distance is long or short. Is it possible for me to get a handicapped sticker?

## 2016-03-29 NOTE — Telephone Encounter (Signed)
Form is upfront for p/u. Pt informed.  

## 2016-05-02 ENCOUNTER — Other Ambulatory Visit: Payer: Self-pay | Admitting: Adult Health

## 2016-05-16 ENCOUNTER — Other Ambulatory Visit: Payer: Self-pay | Admitting: Internal Medicine

## 2016-05-16 DIAGNOSIS — J309 Allergic rhinitis, unspecified: Secondary | ICD-10-CM

## 2016-09-20 ENCOUNTER — Telehealth: Payer: Self-pay | Admitting: Internal Medicine

## 2016-09-20 NOTE — Telephone Encounter (Signed)
Okay to renew? Last OV was 10/2015?

## 2016-09-20 NOTE — Telephone Encounter (Signed)
Pt would like her handicap plackard renewed.

## 2016-09-22 NOTE — Telephone Encounter (Signed)
Ok 6 mo Thx

## 2016-09-23 NOTE — Telephone Encounter (Signed)
LM notifying pt ready to be picked up

## 2016-10-12 ENCOUNTER — Other Ambulatory Visit: Payer: Self-pay | Admitting: Internal Medicine

## 2016-10-22 ENCOUNTER — Ambulatory Visit (INDEPENDENT_AMBULATORY_CARE_PROVIDER_SITE_OTHER): Payer: BC Managed Care – PPO | Admitting: Family Medicine

## 2016-10-22 ENCOUNTER — Encounter: Payer: Self-pay | Admitting: Family Medicine

## 2016-10-22 VITALS — BP 130/86 | HR 95 | Temp 98.9°F | Ht 64.0 in | Wt 284.2 lb

## 2016-10-22 DIAGNOSIS — J301 Allergic rhinitis due to pollen: Secondary | ICD-10-CM | POA: Diagnosis not present

## 2016-10-22 DIAGNOSIS — J4541 Moderate persistent asthma with (acute) exacerbation: Secondary | ICD-10-CM | POA: Diagnosis not present

## 2016-10-22 MED ORDER — PREDNISONE 20 MG PO TABS: 40.0000 mg | ORAL_TABLET | Freq: Every day | ORAL | 0 refills | Status: DC

## 2016-10-22 MED ORDER — CETIRIZINE HCL 10 MG PO TABS: 10.0000 mg | ORAL_TABLET | ORAL | 5 refills | Status: DC | PRN

## 2016-10-22 MED ORDER — ALBUTEROL SULFATE HFA 108 (90 BASE) MCG/ACT IN AERS: 2.0000 | INHALATION_SPRAY | Freq: Four times a day (QID) | RESPIRATORY_TRACT | 1 refills | Status: AC | PRN

## 2016-10-22 NOTE — Progress Notes (Signed)
PCP: Plotnikov, Georgina QuintAleksei V, MD  Subjective:  [name] [name] is a 46 y.o. year old very pleasant female patient who presents with  symptoms including nasal congestion,cough, mild wheeze. Also sneezing more than normal and having watery itchyeyes. Fatigued from coughing so much, tired.had some chills at fist but that has resolved. Coughing up yellow phlegm.  -started: 6 days ago, symptoms show no change -previous treatments: mucinex max strength, advil for slight HA with this robitussen started yesterday. Stopped mucinex Using dulera only once a day prn over last few days- has not been using regularly -Hx of: allergies. Yes on flonase and zyrtec. Was outside extended period last week with work.   ROS-denies fever, SOB, NVD, tooth pain. Mild headaches at times.   Pertinent Past Medical History-  Patient Active Problem List   Diagnosis Date Noted  . Pain in joint, ankle and foot 11/14/2015  . Neck pain, acute 02/01/2015  . Pain in joint, shoulder region 02/01/2015  . Abdominal pain 01/15/2015  . Right shoulder pain 01/12/2015  . Lower back pain 01/12/2015  . Asthmatic bronchitis 10/20/2014  . Dyspepsia 09/22/2014  . Abnormal uterine bleeding (AUB) 02/01/2014  . TMJ (sprain of temporomandibular joint) 04/06/2013  . Impacted cerumen of left ear 04/06/2013  . Morbid obesity (HCC) 11/02/2012  . Weight gain 11/02/2012  . LBP radiating to right leg 07/17/2012  . Right groin pain 07/17/2012  . Fall due to ice or snow 07/17/2012  . Chest pain, atypical 05/05/2012  . Costochondritis 05/05/2012  . Migraine 05/05/2012  . Headache 02/26/2010  . GERD 08/12/2008  . CONSTIPATION 08/12/2008  . ALLERGIC RHINITIS 04/04/2007    Medications- reviewed  Current Outpatient Prescriptions  Medication Sig Dispense Refill  . cetirizine (ZYRTEC) 10 MG tablet Take 10 mg by mouth as needed for allergies.    Marland Kitchen. colchicine 0.6 MG tablet Take 1 tablet (0.6 mg total) by mouth 2 (two) times daily. 60 tablet 0   . fluticasone (FLONASE) 50 MCG/ACT nasal spray Place 2 sprays into both nostrils daily. 16 g 2  . mometasone-formoterol (DULERA) 200-5 MCG/ACT AERO Inhale 2 puffs into the lungs 2 (two) times daily. 3 Inhaler 1  . MUCINEX 600 MG 12 hr tablet TAKE 2 TABLETS BY MOUTH TWICE A DAY AS NEEDED 60 tablet 0  . ondansetron (ZOFRAN) 4 MG tablet 1 tab by mouthe very 6 hrs as needed for nausea 30 tablet 1  . pantoprazole (PROTONIX) 40 MG tablet Take 1 tablet (40 mg total) by mouth daily. 90 tablet 2  . ranitidine (ZANTAC) 150 MG tablet TAKE 1 TABLET (150 MG TOTAL) BY MOUTH 2 (TWO) TIMES DAILY. 180 tablet 1  . SUMAtriptan (IMITREX) 100 MG tablet TAKE 1 TABLET BY MOUTH EVERY 2 HOURS AS NEEDED FOR MIGRAINE 10 tablet 0   No current facility-administered medications for this visit.     Objective: BP 130/86 (BP Location: Left Arm, Patient Position: Sitting, Cuff Size: Large)   Pulse 95   Temp 98.9 F (37.2 C) (Oral)   Ht 5\' 4"  (1.626 m)   Wt 284 lb 3.2 oz (128.9 kg)   SpO2 95%   BMI 48.78 kg/m  Gen: NAD, resting comfortably HEENT: Turbinates pale, TM normal, pharynx mildly erythematous with no tonsilar exudate or edema, no sinus tenderness CV: high normal heart rate but regular, no murmurs rubs or gallops Lungs: CTAB no crackles, wheeze, rhonchi. Intermittent deep cough Ext: no edema Skin: warm, dry, no rash  Assessment/Plan:  Allergy and asthma flare Suspect symptoms  started with having to be outdoors more last week  At baseline is supposed to be on dulera twice a day for control of asthma so missing these doses likely has contributed to asthma flare up (though mild)  We will use a 5 day burst of prednisone and get her back on dulera. Will also send in zyrtec as prescription and get her a rescue inhaler albuterol to use (if wheezing or coughing fits)  Finally, we reviewed reasons to return to care including if symptoms worsen or persist or new concerns arise- particularly worsening shortness of  breath or fever.  Meds ordered this encounter  Medications  . cetirizine (ZYRTEC) 10 MG tablet    Sig: Take 1 tablet (10 mg total) by mouth as needed for allergies.    Dispense:  30 tablet    Refill:  5  . albuterol (PROVENTIL HFA;VENTOLIN HFA) 108 (90 Base) MCG/ACT inhaler    Sig: Inhale 2 puffs into the lungs every 6 (six) hours as needed for wheezing or shortness of breath.    Dispense:  1 Inhaler    Refill:  1  . predniSONE (DELTASONE) 20 MG tablet    Sig: Take 2 tablets (40 mg total) by mouth daily with breakfast.    Dispense:  10 tablet    Refill:  0    Tana Conch, MD

## 2016-10-22 NOTE — Patient Instructions (Signed)
Allergy and asthma flare Suspect symptoms started with having to be outdoors more last week  At baseline is supposed to be on dulera twice a day for control of asthma so missing these doses likely has contributed to asthma flare up (though mild)  We will use a 5 day burst of prednisone and get her back on dulera. Will also send in zyrtec as prescription and get her a rescue inhaler albuterol to use (if wheezing or coughing fits)  Finally, we reviewed reasons to return to care including if symptoms worsen or persist or new concerns arise- particularly worsening shortness of breath or fever.  Meds ordered this encounter  Medications  . cetirizine (ZYRTEC) 10 MG tablet    Sig: Take 1 tablet (10 mg total) by mouth as needed for allergies.    Dispense:  30 tablet    Refill:  5  . albuterol (PROVENTIL HFA;VENTOLIN HFA) 108 (90 Base) MCG/ACT inhaler    Sig: Inhale 2 puffs into the lungs every 6 (six) hours as needed for wheezing or shortness of breath.    Dispense:  1 Inhaler    Refill:  1  . predniSONE (DELTASONE) 20 MG tablet    Sig: Take 2 tablets (40 mg total) by mouth daily with breakfast.    Dispense:  10 tablet    Refill:  0

## 2016-12-05 ENCOUNTER — Encounter: Payer: Self-pay | Admitting: Nurse Practitioner

## 2016-12-05 ENCOUNTER — Ambulatory Visit (INDEPENDENT_AMBULATORY_CARE_PROVIDER_SITE_OTHER): Payer: BC Managed Care – PPO | Admitting: Nurse Practitioner

## 2016-12-05 VITALS — BP 122/80 | HR 75 | Temp 97.9°F | Ht 64.0 in | Wt 286.0 lb

## 2016-12-05 DIAGNOSIS — IMO0001 Reserved for inherently not codable concepts without codable children: Secondary | ICD-10-CM

## 2016-12-05 DIAGNOSIS — E669 Obesity, unspecified: Secondary | ICD-10-CM | POA: Diagnosis not present

## 2016-12-05 DIAGNOSIS — H1032 Unspecified acute conjunctivitis, left eye: Secondary | ICD-10-CM | POA: Diagnosis not present

## 2016-12-05 DIAGNOSIS — G43909 Migraine, unspecified, not intractable, without status migrainosus: Secondary | ICD-10-CM

## 2016-12-05 DIAGNOSIS — Z6841 Body Mass Index (BMI) 40.0 and over, adult: Secondary | ICD-10-CM

## 2016-12-05 MED ORDER — NEOMYCIN-POLYMYXIN-HC 3.5-10000-1 OP SUSP: 2.0000 [drp] | Freq: Three times a day (TID) | OPHTHALMIC | 0 refills | Status: AC

## 2016-12-05 MED ORDER — PHENTERMINE HCL 37.5 MG PO CAPS: 37.5000 mg | ORAL_CAPSULE | ORAL | 1 refills | Status: AC

## 2016-12-05 MED ORDER — SUMATRIPTAN SUCCINATE 100 MG PO TABS: ORAL_TABLET | ORAL | 0 refills | Status: AC

## 2016-12-05 NOTE — Progress Notes (Signed)
Subjective:  Patient ID: [name] [name], female    DOB: 1970-08-21  Age: 46 y.o. MRN: [id number]  CC: Conjunctivitis (left eye burning,pain,watery,sensitive to light going on for 4 days. ranitidine and imitrex refill?)   Eye Problem   The left eye is affected. This is a new problem. The current episode started in the past 7 days. The problem occurs constantly. The problem has been gradually worsening. There was no injury mechanism. The pain is moderate. There is known exposure to pink eye. She wears contacts. Associated symptoms include blurred vision, an eye discharge, eye redness, a foreign body sensation and photophobia. Pertinent negatives include no double vision, fever, itching, nausea, recent URI or vomiting. She has tried water for the symptoms. The treatment provided no relief.   Weight loss consult: Will like to resume phentermine use. Use in past x 6months then stopped. Unable to remember how much weight loss. She has not started any diet or exercise regimen. Unable to afford belviq or contrave. BP Readings from Last 3 Encounters:  12/05/16 122/80  10/22/16 130/86  03/06/16 120/82  no hx of CVD or event.  Migraine: No change in characteristics or frequency. Needs imitrex refill denies any headache in last 2weeks.  Outpatient Medications Prior to Visit  Medication Sig Dispense Refill  . albuterol (PROVENTIL HFA;VENTOLIN HFA) 108 (90 Base) MCG/ACT inhaler Inhale 2 puffs into the lungs every 6 (six) hours as needed for wheezing or shortness of breath. 1 Inhaler 1  . cetirizine (ZYRTEC) 10 MG tablet Take 1 tablet (10 mg total) by mouth as needed for allergies. 30 tablet 5  . colchicine 0.6 MG tablet Take 1 tablet (0.6 mg total) by mouth 2 (two) times daily. 60 tablet 0  . fluticasone (FLONASE) 50 MCG/ACT nasal spray Place 2 sprays into both nostrils daily. 16 g 2  . mometasone-formoterol (DULERA) 200-5 MCG/ACT AERO Inhale 2 puffs into the lungs 2 (two) times daily. 3 Inhaler  1  . MUCINEX 600 MG 12 hr tablet TAKE 2 TABLETS BY MOUTH TWICE A DAY AS NEEDED 60 tablet 0  . ondansetron (ZOFRAN) 4 MG tablet 1 tab by mouthe very 6 hrs as needed for nausea 30 tablet 1  . predniSONE (DELTASONE) 20 MG tablet Take 2 tablets (40 mg total) by mouth daily with breakfast. 10 tablet 0  . ranitidine (ZANTAC) 150 MG tablet TAKE 1 TABLET (150 MG TOTAL) BY MOUTH 2 (TWO) TIMES DAILY. 180 tablet 1  . SUMAtriptan (IMITREX) 100 MG tablet TAKE 1 TABLET BY MOUTH EVERY 2 HOURS AS NEEDED FOR MIGRAINE 10 tablet 0  . pantoprazole (PROTONIX) 40 MG tablet Take 1 tablet (40 mg total) by mouth daily. (Patient not taking: Reported on 12/05/2016) 90 tablet 2   No facility-administered medications prior to visit.     ROS See HPI  Objective:  BP 122/80   Pulse 75   Temp 97.9 F (36.6 C)   Ht 5\' 4"  (1.626 m)   Wt 286 lb (129.7 kg)   SpO2 96%   BMI 49.09 kg/m   BP Readings from Last 3 Encounters:  12/05/16 122/80  10/22/16 130/86  03/06/16 120/82    Wt Readings from Last 3 Encounters:  12/05/16 286 lb (129.7 kg)  10/22/16 284 lb 3.2 oz (128.9 kg)  03/06/16 278 lb 8 oz (126.3 kg)    Physical Exam  Constitutional: No distress.  HENT:  Right Ear: Tympanic membrane, external ear and ear canal normal.  Left Ear: Tympanic membrane, external ear and  ear canal normal.  Nose: Nose normal. Right sinus exhibits no maxillary sinus tenderness and no frontal sinus tenderness. Left sinus exhibits no maxillary sinus tenderness and no frontal sinus tenderness.  Mouth/Throat: Uvula is midline, oropharynx is clear and moist and mucous membranes are normal. No oropharyngeal exudate or posterior oropharyngeal erythema.  Eyes: Pupils are equal, round, and reactive to light. EOM are normal. Right eye exhibits no chemosis, no discharge, no exudate and no hordeolum. No foreign body present in the right eye. Left eye exhibits chemosis, discharge and exudate. Left eye exhibits no hordeolum. No foreign body  present in the left eye. Right conjunctiva is not injected. Right conjunctiva has no hemorrhage. Left conjunctiva is injected. Left conjunctiva has no hemorrhage.  Neck: Normal range of motion. Neck supple.  Cardiovascular: Normal rate, regular rhythm and normal heart sounds.   Pulmonary/Chest: Effort normal and breath sounds normal.  Lymphadenopathy:    She has no cervical adenopathy.  Vitals reviewed.   Lab Results  Component Value Date   WBC 8.3 11/14/2015   HGB 13.2 11/14/2015   HCT 39.2 11/14/2015   PLT 352.0 11/14/2015   GLUCOSE 103 (H) 11/14/2015   ALT 7 11/14/2015   AST 15 11/14/2015   NA 136 11/14/2015   K 4.7 11/14/2015   CL 101 11/14/2015   CREATININE 0.94 11/14/2015   BUN 13 11/14/2015   CO2 29 11/14/2015   TSH 1.07 08/12/2008    US Breast Ltd Uni Left Inc Axilla  Result Date: 03/19/2016 CLINICAL DATA:  Patient presents with a palpable abnormality in the left breast between 6 o'clock and 9 o'clock. EXAM: 2D DIGITAL DIAGNOSTIC BILATERAL MAMMOGRAM WITH CAD AND ADJUNCT TOMO ULTRASOUND the BREAST COMPARISON:  Previous exam(s). ACR Breast Density Category c: The breast tissue is heterogeneously dense, which may obscure small masses. FINDINGS: There are no discrete masses, areas of architectural distortion, areas of significant asymmetry or suspicious calcifications. Mammographic images were processed with CAD. On physical exam, no mass is palpated in the lower or medial aspect of the left breast. Targeted ultrasound is performed, showing normal fibroglandular tissue. No mass, cyst or suspicious lesion. IMPRESSION: 1. Normal exam. No evidence of malignancy. No mammographic or sonographic correlate to the reported palpable abnormality. RECOMMENDATION: Screening mammogram in one year.(Code:SM-B-01Y) I have discussed the findings and recommendations with the patient. Results were also provided in writing at the conclusion of the visit. If applicable, a reminder letter will be sent to  the patient regarding the next appointment. BI-RADS CATEGORY  1: Negative. Electronically Signed   By: Amie Portland M.D.   On: 03/19/2016 15:45   Mm Diag Breast Tomo Bilateral  Result Date: 03/19/2016 CLINICAL DATA:  Patient presents with a palpable abnormality in the left breast between 6 o'clock and 9 o'clock. EXAM: 2D DIGITAL DIAGNOSTIC BILATERAL MAMMOGRAM WITH CAD AND ADJUNCT TOMO ULTRASOUND the BREAST COMPARISON:  Previous exam(s). ACR Breast Density Category c: The breast tissue is heterogeneously dense, which may obscure small masses. FINDINGS: There are no discrete masses, areas of architectural distortion, areas of significant asymmetry or suspicious calcifications. Mammographic images were processed with CAD. On physical exam, no mass is palpated in the lower or medial aspect of the left breast. Targeted ultrasound is performed, showing normal fibroglandular tissue. No mass, cyst or suspicious lesion. IMPRESSION: 1. Normal exam. No evidence of malignancy. No mammographic or sonographic correlate to the reported palpable abnormality. RECOMMENDATION: Screening mammogram in one year.(Code:SM-B-01Y) I have discussed the findings and recommendations with the patient. Results  were also provided in writing at the conclusion of the visit. If applicable, a reminder letter will be sent to the patient regarding the next appointment. BI-RADS CATEGORY  1: Negative. Electronically Signed   By: Amie Portland M.D.   On: 03/19/2016 15:45    Assessment & Plan:   Megahn was seen today for conjunctivitis.  Diagnoses and all orders for this visit:  Acute bacterial conjunctivitis of left eye -     neomycin-polymyxin-hydrocortisone (CORTISPORIN) 3.5-10000-1 ophthalmic suspension; Place 2 drops into the left eye 3 (three) times daily.  Class 3 obesity without serious comorbidity with body mass index (BMI) of 45.0 to 49.9 in adult, unspecified obesity type (HCC) -     phentermine 37.5 MG capsule; Take 1 capsule  (37.5 mg total) by mouth every morning.  Migraine without status migrainosus, not intractable, unspecified migraine type -     SUMAtriptan (IMITREX) 100 MG tablet; TAKE 1 TABLET BY MOUTH EVERY 2 HOURS AS NEEDED FOR MIGRAINE   I am having Ms. Mackert start on phentermine and neomycin-polymyxin-hydrocortisone. I am also having her maintain her ondansetron, mometasone-formoterol, fluticasone, pantoprazole, colchicine, MUCINEX, ranitidine, cetirizine, albuterol, predniSONE, and SUMAtriptan.  Meds ordered this encounter  Medications  . phentermine 37.5 MG capsule    Sig: Take 1 capsule (37.5 mg total) by mouth every morning.    Dispense:  30 capsule    Refill:  1    Order Specific Question:   Supervising Provider    Answer:   Tresa Garter [[id number]]  . SUMAtriptan (IMITREX) 100 MG tablet    Sig: TAKE 1 TABLET BY MOUTH EVERY 2 HOURS AS NEEDED FOR MIGRAINE    Dispense:  10 tablet    Refill:  0    Order Specific Question:   Supervising Provider    Answer:   Tresa Garter [[id number]]  . neomycin-polymyxin-hydrocortisone (CORTISPORIN) 3.5-10000-1 ophthalmic suspension    Sig: Place 2 drops into the left eye 3 (three) times daily.    Dispense:  7.5 mL    Refill:  0    Order Specific Question:   Supervising Provider    Answer:   Tresa Garter [[id number]]    Follow-up: Return in about 2 months (around 02/05/2017) for with Dr. Posey Rea (weight loss).  Alysia Penna, NP

## 2016-12-05 NOTE — Patient Instructions (Signed)

## 2017-02-02 ENCOUNTER — Other Ambulatory Visit: Payer: Self-pay | Admitting: Family Medicine

## 2017-03-17 ENCOUNTER — Encounter: Payer: Self-pay | Admitting: Internal Medicine

## 2017-03-20 ENCOUNTER — Other Ambulatory Visit: Payer: Self-pay | Admitting: Internal Medicine

## 2017-03-21 NOTE — Telephone Encounter (Signed)
Pt called in to check the status of this?

## 2017-06-27 ENCOUNTER — Other Ambulatory Visit: Payer: Self-pay | Admitting: Internal Medicine

## 2017-07-24 ENCOUNTER — Ambulatory Visit: Payer: BC Managed Care – PPO | Admitting: Family Medicine

## 2017-07-24 ENCOUNTER — Encounter: Payer: Self-pay | Admitting: Family Medicine

## 2017-07-24 VITALS — BP 138/76 | HR 86 | Temp 98.2°F | Ht 64.0 in | Wt 285.0 lb

## 2017-07-24 DIAGNOSIS — J069 Acute upper respiratory infection, unspecified: Secondary | ICD-10-CM | POA: Insufficient documentation

## 2017-07-24 NOTE — Patient Instructions (Signed)
Please try things such as zyrtec-D or allegra-D which is an antihistamine and decongestant.   Please try afrin which will help with nasal congestion but use for only three days.   Please also try using a netti pot on a regular occasion.  Honey can help with a sore throat.   Vick's and Delsym can help with cough   A humidifier can help as well.

## 2017-07-24 NOTE — Assessment & Plan Note (Signed)
Symptoms are likely viral in nature.  - counseled on supportive care  - given indications to follow up.

## 2017-07-24 NOTE — Progress Notes (Signed)
[name] [name] - 47 y.o. female MRN [id number]  Date of birth: 07-13-1970  SUBJECTIVE:  Including CC & ROS.  Chief Complaint  Patient presents with  . Headache    [name] [name] is a 47 y.o. female that is presenting with a headache, congestion and sore throat. Ongoing for three days. Admits to fevers. Denies body aches or chills. Feels like her headache is sinus pressure. She has been around family with similar symptoms. Her daughter and husband recently had the flu. She has been taking Motrin and nasal spray. Denies light sensitivity. She does get the flu shot.    Review of Systems  Constitutional: Negative for fever.  HENT: Positive for congestion and sore throat.   Eyes: Negative for photophobia.  Cardiovascular: Negative for chest pain.  Gastrointestinal: Negative for abdominal pain.  Musculoskeletal: Negative for back pain.  Neurological: Negative for weakness.  Hematological: Negative for adenopathy.  Psychiatric/Behavioral: Negative for agitation.    HISTORY: Past Medical, Surgical, Social, and Family History Reviewed & Updated per EMR.   Pertinent Historical Findings include:  Past Medical History:  Diagnosis Date  . Abnormal uterine bleeding (AUB)   . Allergic rhinitis   . Anemia    for AUB  --  transfused 02-03-2014  . GERD (gastroesophageal reflux disease)   . Headache(784.0)   . TMJ (temporomandibular joint syndrome)   . Wears contact lenses     Past Surgical History:  Procedure Laterality Date  . BUNIONECTOMY Left 2012  . CESAREAN SECTION  10-21-2000  . DILATION AND CURETTAGE OF UTERUS  1999   with suction  . DILATION AND CURETTAGE OF UTERUS N/A 07/14/2013   Procedure: DILATATION AND CURETTAGE;  Surgeon: [name], MD;  Location: [facility];  Service: Gynecology;  Laterality: N/A;  . DILITATION & CURRETTAGE/HYSTROSCOPY WITH NOVASURE ABLATION N/A 03/04/2014   Procedure: DILATATION & CURETTAGE/HYSTEROSCOPY WITH NOVASURE ABLATION;  Surgeon: [name], MD;  Location: [facility];  Service: Gynecology;  Laterality: N/A;    Allergies  Allergen Reactions  . Codeine Nausea And Vomiting    Can tolerate when given phenergan    Family History  Problem Relation Age of Onset  . Hypertension Mother   . Stroke Father      Social History   Socioeconomic History  . Marital status: Married    Spouse name: Not on file  . Number of children: Not on file  . Years of education: Not on file  . Highest education level: Not on file  Social Needs  . Financial resource strain: Not on file  . Food insecurity - worry: Not on file  . Food insecurity - inability: Not on file  . Transportation needs - medical: Not on file  . Transportation needs - non-medical: Not on file  Occupational History  . Not on file  Tobacco Use  . Smoking status: Never Smoker  . Smokeless tobacco: Never Used  Substance and Sexual Activity  . Alcohol use: No    Comment: occasional  . Drug use: No  . Sexual activity: Not on file  Other Topics Concern  . Not on file  Social History Narrative  . Not on file     PHYSICAL EXAM:  VS: BP 138/76 (BP Location: Left Arm, Patient Position: Sitting, Cuff Size: Large)   Pulse 86   Temp 98.2 F (36.8 C) (Oral)   Ht 5\' 4"  (1.626 m)   Wt 285 lb (129.3 kg)   SpO2 98%   BMI  48.92 kg/m  Physical Exam Gen: NAD, alert, cooperative with exam,  ENT: normal lips, normal nasal mucosa, tympanic membranes clear and intact bilaterally, normal oropharynx, no cervical lymphadenopathy Eye: normal EOM, normal conjunctiva and lids CV:  no edema, +2 pedal pulses, regular rate and rhythm, S1-S2   Resp: no accessory muscle use, non-labored, clear to auscultation bilaterally, no crackles or wheezes Skin: no rashes, no areas of induration  Neuro: normal tone, normal sensation to touch Psych:  normal insight, alert and oriented MSK: Normal gait, normal strength       ASSESSMENT & PLAN:   Upper respiratory  tract infection Symptoms are likely viral in nature.  - counseled on supportive care  - given indications to follow up.

## 2017-08-11 ENCOUNTER — Ambulatory Visit: Payer: BC Managed Care – PPO | Admitting: Family Medicine

## 2017-08-11 ENCOUNTER — Encounter: Payer: Self-pay | Admitting: Family Medicine

## 2017-08-11 VITALS — BP 120/64 | HR 90 | Temp 98.2°F | Wt 277.0 lb

## 2017-08-11 DIAGNOSIS — B9789 Other viral agents as the cause of diseases classified elsewhere: Secondary | ICD-10-CM | POA: Diagnosis not present

## 2017-08-11 DIAGNOSIS — R0982 Postnasal drip: Secondary | ICD-10-CM | POA: Diagnosis not present

## 2017-08-11 DIAGNOSIS — J069 Acute upper respiratory infection, unspecified: Secondary | ICD-10-CM | POA: Diagnosis not present

## 2017-08-11 NOTE — Patient Instructions (Addendum)
Viral Respiratory Infection A respiratory infection is an illness that affects part of the respiratory system, such as the lungs, nose, or throat. Most respiratory infections are caused by either viruses or bacteria. A respiratory infection that is caused by a virus is called a viral respiratory infection. Common types of viral respiratory infections include:  A cold.  The flu (influenza).  A respiratory syncytial virus (RSV) infection.  How do I know if I have a viral respiratory infection? Most viral respiratory infections cause:  A stuffy or runny nose.  Yellow or green nasal discharge.  A cough.  Sneezing.  Fatigue.  Achy muscles.  A sore throat.  Sweating or chills.  A fever.  A headache.  How are viral respiratory infections treated? If influenza is diagnosed early, it may be treated with an antiviral medicine that shortens the length of time a person has symptoms. Symptoms of viral respiratory infections may be treated with over-the-counter and prescription medicines, such as:  Expectorants. These make it easier to cough up mucus.  Decongestant nasal sprays.  Health care providers do not prescribe antibiotic medicines for viral infections. This is because antibiotics are designed to kill bacteria. They have no effect on viruses. How do I know if I should stay home from work or school? To avoid exposing others to your respiratory infection, stay home if you have:  A fever.  A persistent cough.  A sore throat.  A runny nose.  Sneezing.  Muscles aches.  Headaches.  Fatigue.  Weakness.  Chills.  Sweating.  Nausea.  Follow these instructions at home:  Rest as much as possible.  Take over-the-counter and prescription medicines only as told by your health care provider.  Drink enough fluid to keep your urine clear or pale yellow. This helps prevent dehydration and helps loosen up mucus.  Gargle with a salt-water mixture 3-4 times per day or  as needed. To make a salt-water mixture, completely dissolve -1 tsp of salt in 1 cup of warm water.  Use nose drops made from salt water to ease congestion and soften raw skin around your nose.  Do not drink alcohol.  Do not use tobacco products, including cigarettes, chewing tobacco, and e-cigarettes. If you need help quitting, ask your health care provider. Contact a health care provider if:  Your symptoms last for 10 days or longer.  Your symptoms get worse over time.  You have a fever.  You have severe sinus pain in your face or forehead.  The glands in your jaw or neck become very swollen. Get help right away if:  You feel pain or pressure in your chest.  You have shortness of breath.  You faint or feel like you will faint.  You have severe and persistent vomiting.  You feel confused or disoriented. This information is not intended to replace advice given to you by your health care provider. Make sure you discuss any questions you have with your health care provider. Document Released: 02/20/2005 Document Revised: 10/19/2015 Document Reviewed: 10/19/2014 Elsevier Interactive Patient Education  2018 Elsevier Inc.  Postnasal Drip Postnasal drip is the feeling of mucus going down the back of your throat. Mucus is a slimy substance that moistens and cleans your nose and throat, as well as the air pockets in face bones near your forehead and cheeks (sinuses). Small amounts of mucus pass from your nose and sinuses down the back of your throat all the time. This is normal. When you produce too much mucus or   the mucus gets too thick, you can feel it. Some common causes of postnasal drip include:  Having more mucus because of: ? A cold or the flu. ? Allergies. ? Cold air. ? Certain medicines.  Having more mucus that is thicker because of: ? A sinus or nasal infection. ? Dry air. ? A food allergy.  Follow these instructions at home: Relieving discomfort  Gargle with a  salt-water mixture 3-4 times a day or as needed. To make a salt-water mixture, completely dissolve -1 tsp of salt in 1 cup of warm water.  If the air in your home is dry, use a humidifier to add moisture to the air.  Use a saline spray or container (neti pot) to flush out the nose (nasal irrigation). These methods can help clear away mucus and keep the nasal passages moist. General instructions  Take over-the-counter and prescription medicines only as told by your health care provider.  Follow instructions from your health care provider about eating or drinking restrictions. You may need to avoid caffeine.  Avoid things that you know you are allergic to (allergens), like dust, mold, pollen, pets, or certain foods.  Drink enough fluid to keep your urine pale yellow.  Keep all follow-up visits as told by your health care provider. This is important. Contact a health care provider if:  You have a fever.  You have a sore throat.  You have difficulty swallowing.  You have headache.  You have sinus pain.  You have a cough that does not go away.  The mucus from your nose becomes thick and is green or yellow in color.  You have cold or flu symptoms that last more than 10 days. Summary  Postnasal drip is the feeling of mucus going down the back of your throat.  If your health care provider approves, use nasal irrigation or a nasal spray 2?4 times a day.  Avoid things that you know you are allergic to (allergens), like dust, mold, pollen, pets, or certain foods. This information is not intended to replace advice given to you by your health care provider. Make sure you discuss any questions you have with your health care provider. Document Released: 08/26/2016 Document Revised: 08/26/2016 Document Reviewed: 08/26/2016 Elsevier Interactive Patient Education  2018 Elsevier Inc.  

## 2017-08-11 NOTE — Progress Notes (Signed)
Subjective:    Patient ID: [name] SorensonMelissa R [name], female    DOB: 07-17-1970, 47 y.o.   MRN: [id number]  Chief Complaint  Patient presents with  . Cough    HPI Patient was seen today for acute concern.  Patient endorses feeling hot/cold, productive cough, hoarse voice, sneezing, headache, rhinorrhea, and tired since Wednesday. Pt also noted soreness/stiffness in her neck when her symptoms first started--this has now resolved.  Pt states everyone in her house has been sick and she is last to get it.  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  Pt has taken NyQuil, Mucinex, Zyrtec, ibuprofen.  Past Medical History:  Diagnosis Date  . Abnormal uterine bleeding (AUB)   . Allergic rhinitis   . Anemia    for AUB  --  transfused 02-03-2014  . GERD (gastroesophageal reflux disease)   . Headache(784.0)   . TMJ (temporomandibular joint syndrome)   . Wears contact lenses     Allergies  Allergen Reactions  . Codeine Nausea And Vomiting    Can tolerate when given phenergan    ROS General: Denies fever, chills, night sweats, changes in weight, changes in appetite  +tired, feeling cold/hot HEENT: Denies ear pain, changes in vision, sore throat  + rhinorrhea, sneezing, HA CV: Denies CP, palpitations, SOB, orthopnea Pulm: Denies SOB, wheezing  +cough GI: Denies abdominal pain, nausea, vomiting, diarrhea, constipation GU: Denies dysuria, hematuria, frequency, vaginal discharge Msk: Denies muscle cramps, joint pains Neuro: Denies weakness, numbness, tingling Skin: Denies rashes, bruising Psych: Denies depression, anxiety, hallucinations     Objective:    Blood pressure 120/64, pulse 90, temperature 98.2 F (36.8 C), temperature source Oral, weight 277 lb (125.6 kg), SpO2 95 %.   Gen. Pleasant, well-nourished, in no distress, normal affect  HEENT: [place]/AT, face symmetric, no scleral icterus, PERRLA, nares patent without drainage, pharynx with mild  erythema and post nasal drainage, no exudate.  TMs normal bilaterally.  No cervical lymphadenopathy. Lungs: no accessory muscle use, CTAB, no wheezes or rales Cardiovascular: RRR, no m/r/g, no peripheral edema Abdomen: BS present, soft, NT/ND Neuro:  A&Ox3, CN II-XII intact, normal gait   Wt Readings from Last 3 Encounters:  08/11/17 277 lb (125.6 kg)  07/24/17 285 lb (129.3 kg)  12/05/16 286 lb (129.7 kg)    Lab Results  Component Value Date   WBC 8.3 11/14/2015   HGB 13.2 11/14/2015   HCT 39.2 11/14/2015   PLT 352.0 11/14/2015   GLUCOSE 103 (H) 11/14/2015   ALT 7 11/14/2015   AST 15 11/14/2015   NA 136 11/14/2015   K 4.7 11/14/2015   CL 101 11/14/2015   CREATININE 0.94 11/14/2015   BUN 13 11/14/2015   CO2 29 11/14/2015   TSH 1.07 08/12/2008    Assessment/Plan:  Viral URI with cough -Supportive care -Given handout -Suggested Mucinex and Flonase for symptoms  Post-nasal drainage -Discussed using Flonase  Follow-up with PCP as needed  Abbe AmsterdamShannon Rachyl Wuebker, MD

## 2017-08-12 ENCOUNTER — Encounter: Payer: Self-pay | Admitting: Family Medicine

## 2017-09-23 ENCOUNTER — Encounter: Payer: Self-pay | Admitting: Internal Medicine

## 2018-01-21 ENCOUNTER — Other Ambulatory Visit: Payer: Self-pay | Admitting: Obstetrics and Gynecology

## 2018-01-21 DIAGNOSIS — R928 Other abnormal and inconclusive findings on diagnostic imaging of breast: Secondary | ICD-10-CM

## 2018-01-27 ENCOUNTER — Ambulatory Visit
Admission: RE | Admit: 2018-01-27 | Discharge: 2018-01-27 | Disposition: A | Discharge status: Home / Self Care | Payer: BC Managed Care – PPO | Source: Ambulatory Visit | Attending: Obstetrics and Gynecology | Admitting: Obstetrics and Gynecology

## 2018-01-27 DIAGNOSIS — R928 Other abnormal and inconclusive findings on diagnostic imaging of breast: Secondary | ICD-10-CM

## 2018-01-28 ENCOUNTER — Other Ambulatory Visit: Payer: Self-pay | Admitting: Internal Medicine

## 2018-02-08 ENCOUNTER — Other Ambulatory Visit: Payer: Self-pay | Admitting: Internal Medicine

## 2018-05-22 ENCOUNTER — Telehealth: Payer: Self-pay | Admitting: Internal Medicine

## 2018-05-22 NOTE — Telephone Encounter (Signed)
Patient reports she is having congestion, sneezing, itching in mouth - the Zyrtec is not working to control allergies. Patient had some prednisone left that she was using- but now she is requesting more. Patient request appointment to be seen for symptoms. Appointment scheduled for sinus symptoms- no medication sent to pharmacy at this time.

## 2018-05-22 NOTE — Telephone Encounter (Signed)
Copied from CRM [phone]#[id number]. Topic: Quick Communication - Rx Refill/Question >> May 22, 2018 12:20 PM [name], [name] E wrote: Medication: predniSONE (DELTASONE) 20 MG tablet and  MUCINEX 600 MG 12 hr tablet (Pt would like these called in)   Pt was advise that ranitidine (ZANTAC) 150 MG tablet was recalled and would like to know if something else can be called in due to the recall.    Has the patient contacted their pharmacy? No.  Preferred Pharmacy (with phone number or street name): [id number] [place] - [address] [phone] (Fax)    Agent: Please be advised that RX refills may take up to 3 business days. We ask that you follow-up with your pharmacy.

## 2018-05-22 NOTE — Telephone Encounter (Signed)
Noted, seeing Dr. Dayton MartesAron Saturday

## 2018-05-23 ENCOUNTER — Ambulatory Visit: Payer: BC Managed Care – PPO | Admitting: Family Medicine

## 2018-05-23 ENCOUNTER — Encounter: Payer: Self-pay | Admitting: Family Medicine

## 2018-05-23 VITALS — BP 138/86 | HR 76 | Temp 98.7°F | Resp 16 | Ht 64.0 in | Wt 291.0 lb

## 2018-05-23 DIAGNOSIS — J069 Acute upper respiratory infection, unspecified: Secondary | ICD-10-CM | POA: Diagnosis not present

## 2018-05-23 DIAGNOSIS — K219 Gastro-esophageal reflux disease without esophagitis: Secondary | ICD-10-CM

## 2018-05-23 MED ORDER — FAMOTIDINE 20 MG PO TABS: 20.0000 mg | ORAL_TABLET | Freq: Every day | ORAL | 0 refills | Status: DC

## 2018-05-23 MED ORDER — PREDNISONE 10 MG PO TABS: ORAL_TABLET | ORAL | 0 refills | Status: AC

## 2018-05-23 MED ORDER — DM-GUAIFENESIN ER 30-600 MG PO TB12: 1.0000 | ORAL_TABLET | Freq: Two times a day (BID) | ORAL | 0 refills | Status: AC | PRN

## 2018-05-23 NOTE — Assessment & Plan Note (Signed)
With allergic rhinitis and bronchospasm. Given duration and progression of symptoms, along with her history, will treat with short steroid taper and mucinex DM.  Symptomatic therapy suggested: push fluids, rest and return office visit prn if symptoms persist or worsen. Lack of antibiotic effectiveness discussed with her. Call or return to clinic prn if these symptoms worsen or fail to improve as anticipated.

## 2018-05-23 NOTE — Patient Instructions (Addendum)
Great to meet you. Please take prednisone as directed- in the morning and with food.   mucinex as directed- drink plenty of fluids with that.  I have also sent in famotidine (pepcid) to replace zantac.  Follow up with your doctor next week if you are not feeling better.

## 2018-05-23 NOTE — Assessment & Plan Note (Signed)
On protonix but also takes H2 blocker - zantac. Asking for rx for another h2 blocker since zantac has been recalled. eRx sent for pepcid 20 mg daily. Call PCP if these symptoms worsen or fail to improve as anticipated.

## 2018-05-23 NOTE — Progress Notes (Signed)
SUBJECTIVE:  [name] [name] is a 47 y.o. female new to me who presents to weekend clinic with complaint of coryza, congestion, post nasal drip and productive cough for 5 days. She denies a history of anorexia, chest pain, myalgias and shortness of breath and has a history of asthma. Patient denies smoke cigarettes.   Currently taking zyrtec, flonase.   No fevers, chills has been wheezing.   She states that she gets sick like this once a year, when zyrtec is calming down her allergies.  Per pt usually needs course of prednisone and mucinex.  Often needs to use her inhaler but has not used it yet.  She also has history of GERD and asking what she can take instead of zantac now that it has been recalled.   Current Outpatient Medications on File Prior to Visit  Medication Sig Dispense Refill  . albuterol (PROVENTIL HFA;VENTOLIN HFA) 108 (90 Base) MCG/ACT inhaler Inhale 2 puffs into the lungs every 6 (six) hours as needed for wheezing or shortness of breath. 1 Inhaler 1  . cetirizine (ZYRTEC) 10 MG tablet Take 1 tablet (10 mg total) by mouth daily as needed for allergies. Patient needs office visit before refills will be given 90 tablet 0  . fluticasone (FLONASE) 50 MCG/ACT nasal spray Place 2 sprays into both nostrils daily. 16 g 2  . mometasone-formoterol (DULERA) 200-5 MCG/ACT AERO Inhale 2 puffs into the lungs 2 (two) times daily. 3 Inhaler 1  . neomycin-polymyxin-hydrocortisone (CORTISPORIN) 3.5-10000-1 ophthalmic suspension Place 2 drops into the left eye 3 (three) times daily. 7.5 mL 0  . ondansetron (ZOFRAN) 4 MG tablet 1 tab by mouthe very 6 hrs as needed for nausea 30 tablet 1  . pantoprazole (PROTONIX) 40 MG tablet Take 1 tablet (40 mg total) by mouth daily. 90 tablet 2  . phentermine 37.5 MG capsule Take 1 capsule (37.5 mg total) by mouth every morning. 30 capsule 1  . ranitidine (ZANTAC) 150 MG tablet TAKE 1 TABLET BY MOUTH 2 TIMES DAILY. **PT NEEDS APPOINTMENT FOR ADDITIONAL REFILLS**  180 tablet 3  . SUMAtriptan (IMITREX) 100 MG tablet TAKE 1 TABLET BY MOUTH EVERY 2 HOURS AS NEEDED FOR MIGRAINE 10 tablet 0   No current facility-administered medications on file prior to visit.     Allergies  Allergen Reactions  . Codeine Nausea And Vomiting    Can tolerate when given phenergan    Past Medical History:  Diagnosis Date  . Abnormal uterine bleeding (AUB)   . Allergic rhinitis   . Anemia    for AUB  --  transfused 02-03-2014  . GERD (gastroesophageal reflux disease)   . Headache(784.0)   . TMJ (temporomandibular joint syndrome)   . Wears contact lenses     Past Surgical History:  Procedure Laterality Date  . BUNIONECTOMY Left 2012  . CESAREAN SECTION  10-21-2000  . DILATION AND CURETTAGE OF UTERUS  1999   with suction  . DILATION AND CURETTAGE OF UTERUS N/A 07/14/2013   Procedure: DILATATION AND CURETTAGE;  Surgeon: Meriel Picaichard M Holland, MD;  Location: [facility];  Service: Gynecology;  Laterality: N/A;  . DILITATION & CURRETTAGE/HYSTROSCOPY WITH NOVASURE ABLATION N/A 03/04/2014   Procedure: DILATATION & CURETTAGE/HYSTEROSCOPY WITH NOVASURE ABLATION;  Surgeon: Meriel Picaichard M Holland, MD;  [facility];  Service: Gynecology;  Laterality: N/A;    Family History  Problem Relation Age of Onset  . Hypertension Mother   . Stroke Father     Social History   Socioeconomic  History  . Marital status: Married    Spouse name: Not on file  . Number of children: Not on file  . Years of education: Not on file  . Highest education level: Not on file  Occupational History  . Not on file  Social Needs  . Financial resource strain: Not on file  . Food insecurity:    Worry: Not on file    Inability: Not on file  . Transportation needs:    Medical: Not on file    Non-medical: Not on file  Tobacco Use  . Smoking status: Never Smoker  . Smokeless tobacco: Never Used  Substance and Sexual Activity  . Alcohol use: No    Comment: occasional  . Drug  use: No  . Sexual activity: Not on file  Lifestyle  . Physical activity:    Days per week: Not on file    Minutes per session: Not on file  . Stress: Not on file  Relationships  . Social connections:    Talks on phone: Not on file    Gets together: Not on file    Attends religious service: Not on file    Active member of club or organization: Not on file    Attends meetings of clubs or organizations: Not on file    Relationship status: Not on file  . Intimate partner violence:    Fear of current or ex partner: Not on file    Emotionally abused: Not on file    Physically abused: Not on file    Forced sexual activity: Not on file  Other Topics Concern  . Not on file  Social History Narrative  . Not on file   The PMH, PSH, Social History, Family History, Medications, and allergies have been [facility], and have been updated if relevant.  OBJECTIVE: BP 138/86 (BP Location: Left Arm, Patient Position: Sitting, Cuff Size: Large)   Pulse 76   Temp 98.7 F (37.1 C) (Oral)   Resp 16   Ht 5\' 4"  (1.626 m)   Wt 291 lb (132 kg)   SpO2 96%   BMI 49.95 kg/m   She appears well, vital signs are as noted. Ears normal.  Throat and pharynx normal.  Neck supple. No adenopathy in the neck. Nose is congested. Sinuses non tender. Scattered wheezes on exam.

## 2018-06-14 ENCOUNTER — Other Ambulatory Visit: Payer: Self-pay | Admitting: Family Medicine

## 2018-07-16 ENCOUNTER — Ambulatory Visit: Payer: BC Managed Care – PPO | Admitting: Family Medicine

## 2018-07-16 ENCOUNTER — Encounter: Payer: Self-pay | Admitting: Family Medicine

## 2018-07-16 VITALS — BP 150/80 | HR 77 | Temp 98.8°F | Wt 283.4 lb

## 2018-07-16 DIAGNOSIS — R6889 Other general symptoms and signs: Secondary | ICD-10-CM | POA: Diagnosis not present

## 2018-07-16 DIAGNOSIS — J019 Acute sinusitis, unspecified: Secondary | ICD-10-CM

## 2018-07-16 LAB — POC INFLUENZA A&B (BINAX/QUICKVUE)
Influenza A, POC: NEGATIVE
Influenza B, POC: NEGATIVE

## 2018-07-16 MED ORDER — AZITHROMYCIN 250 MG PO TABS: ORAL_TABLET | ORAL | 0 refills | Status: AC

## 2018-07-16 NOTE — Progress Notes (Signed)
   Subjective:    Patient ID: [name] [name], female    DOB: 1971-01-23, 48 y.o.   MRN: [id number]  HPI Here for 2 days of stuffy head, PND, blowing yellow mucus from the nose, and a dry cough. No fever or body aches. Her daughter was diagnosed with flu B last week.    Review of Systems  Constitutional: Negative.   HENT: Positive for congestion, postnasal drip and sinus pressure. Negative for sore throat.   Eyes: Negative.   Respiratory: Positive for cough.        Objective:   Physical Exam Constitutional:      Appearance: Normal appearance.  HENT:     Right Ear: Tympanic membrane and ear canal normal.     Left Ear: Tympanic membrane and ear canal normal.     Nose: Nose normal.     Mouth/Throat:     Pharynx: Oropharynx is clear.  Eyes:     Conjunctiva/sclera: Conjunctivae normal.  Pulmonary:     Effort: Pulmonary effort is normal. No respiratory distress.     Breath sounds: Normal breath sounds. No stridor. No wheezing, rhonchi or rales.  Lymphadenopathy:     Cervical: No cervical adenopathy.  Neurological:     Mental Status: She is alert.           Assessment & Plan:  Sinusitis, treat with a Zpack. Written out of work today until 07-20-18. Gershon Crane, MD

## 2018-08-26 ENCOUNTER — Other Ambulatory Visit: Payer: Self-pay | Admitting: Family Medicine

## 2018-08-26 ENCOUNTER — Other Ambulatory Visit: Payer: Self-pay

## 2019-03-10 ENCOUNTER — Ambulatory Visit: Payer: Self-pay | Admitting: *Deleted

## 2019-03-10 NOTE — Telephone Encounter (Signed)
Pt called with complaints left facial swelling and tingling; it started 03/09/2019 around 2230; the area affected is her left cheek, chin, and top lip; she did her normal nightly routine, except she had preivously used some [employer] bathroom clear; she also used I squirt of Vicks nasal spray in each nostril, and Vicks vapor rub inside her nostrils; she did not use gloves when she used the cleaner, but she did wash her hands afterwards; recommendations made per nurse triage protocol; she verbalized understanding, and will call back if she worsens; the pt sees Dr Marquita Palms; will route to office for notification.  Reason for Disposition . Mild facial swelling of unknown cause  Answer Assessment - Initial Assessment Questions 1. ONSET: "When did the swelling start?" (e.g., minutes, hours, days)     Hours 03/09/2019 at 2230 2. LOCATION: "What part of the face is swollen?"     Left cheek, entire upper lip, and left lower lip,and chin 3. SEVERITY: "How swollen is it?"     Mild but can tell face is swollen 4. ITCHING: "Is there any itching?" If so, ask: "How much?"   (Scale 1-10; mild, moderate or severe)    no 5. PAIN: "Is the swelling painful to touch?" If so, ask: "How painful is it?"   (Scale 1-10; mild, moderate or severe)     no 6. FEVER: "Do you have a fever?" If so, ask: "What is it, how was it measured, and when did it start?"     no 7. CAUSE: "What do you think is causing the face swelling?"     Not sure 8. RECURRENT SYMPTOM: "Have you had face swelling before?" If so, ask: "When was the last time?" "What happened that time?"    no 9. OTHER SYMPTOMS: "Do you have any other symptoms?" (e.g., toothache, leg swelling)     no 10. PREGNANCY: "Is there any chance you are pregnant?" "When was your last menstrual period?"     No ablation  Protocols used: [facility]

## 2019-03-10 NOTE — Telephone Encounter (Signed)
noted 

## 2019-07-12 ENCOUNTER — Other Ambulatory Visit: Payer: Self-pay | Admitting: Obstetrics and Gynecology

## 2019-07-12 DIAGNOSIS — R928 Other abnormal and inconclusive findings on diagnostic imaging of breast: Secondary | ICD-10-CM

## 2019-07-14 ENCOUNTER — Other Ambulatory Visit: Payer: Self-pay

## 2019-07-14 ENCOUNTER — Ambulatory Visit: Payer: BC Managed Care – PPO

## 2019-07-14 ENCOUNTER — Ambulatory Visit
Admission: RE | Admit: 2019-07-14 | Discharge: 2019-07-14 | Disposition: A | Discharge status: Home / Self Care | Payer: BC Managed Care – PPO | Source: Ambulatory Visit | Attending: Obstetrics and Gynecology | Admitting: Obstetrics and Gynecology

## 2019-07-14 DIAGNOSIS — R928 Other abnormal and inconclusive findings on diagnostic imaging of breast: Secondary | ICD-10-CM

## 2019-08-19 ENCOUNTER — Ambulatory Visit: Payer: BC Managed Care – PPO | Attending: Family

## 2019-08-19 DIAGNOSIS — Z23 Encounter for immunization: Secondary | ICD-10-CM

## 2019-08-19 NOTE — Progress Notes (Signed)
   Covid-19 Vaccination Clinic  Name:  [name] [name]    MRN: [id number] DOB: 1970-09-21  08/19/2019  [name] was observed post Covid-19 immunization for 15 minutes without incident. She was provided with Vaccine Information Sheet and instruction to access the V-Safe system.   [name] was instructed to call 911 with any severe reactions post vaccine: Marland Kitchen Difficulty breathing  . Swelling of face and throat  . A fast heartbeat  . A bad rash all over body  . Dizziness and weakness   Immunizations Administered    Name Date Dose VIS Date Route   Moderna COVID-19 Vaccine 08/19/2019  3:12 PM 0.5 mL 04/27/2019 Intramuscular   Manufacturer: Moderna   Lot: [id number]   NDC: 50871-994-12

## 2019-09-21 ENCOUNTER — Ambulatory Visit: Payer: BC Managed Care – PPO | Attending: Family

## 2019-09-21 DIAGNOSIS — Z23 Encounter for immunization: Secondary | ICD-10-CM

## 2019-09-21 NOTE — Progress Notes (Signed)
   Covid-19 Vaccination Clinic  Name:  [name] [name]    MRN: [id number] DOB: 1971/02/16  09/21/2019  [name] was observed post Covid-19 immunization for 15 minutes without incident. She was provided with Vaccine Information Sheet and instruction to access the V-Safe system.   [name] was instructed to call 911 with any severe reactions post vaccine: Marland Kitchen Difficulty breathing  . Swelling of face and throat  . A fast heartbeat  . A bad rash all over body  . Dizziness and weakness   Immunizations Administered    Name Date Dose VIS Date Route   Moderna COVID-19 Vaccine 09/21/2019  2:50 PM 0.5 mL 04/2019 Intramuscular   Manufacturer: Moderna   Lot: [id number]   NDC: 55001-642-90

## 2019-10-13 ENCOUNTER — Other Ambulatory Visit: Payer: BC Managed Care – PPO

## 2020-01-06 IMAGING — US ULTRASOUND RIGHT BREAST LIMITED
1 series · 5 of 5 positions shown · non-contrast
Comparison: Previous exam(s).

CLINICAL DATA: Screening recall for possible mass in the right
breast.

EXAM:
DIGITAL DIAGNOSTIC RIGHT MAMMOGRAM WITH CAD AND TOMO
ULTRASOUND RIGHT BREAST

[Series 1: ultrasound right breast limited · 0.07mm/px · 5 of 5 slices shown]
[im 1/5]
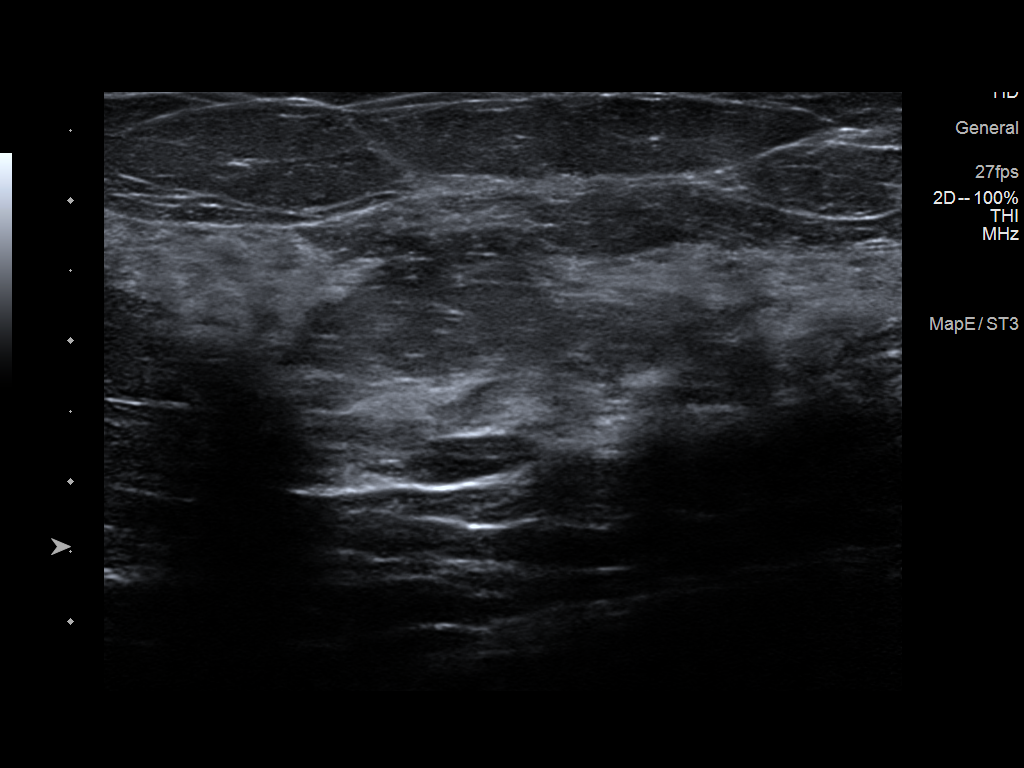
[im 2/5]
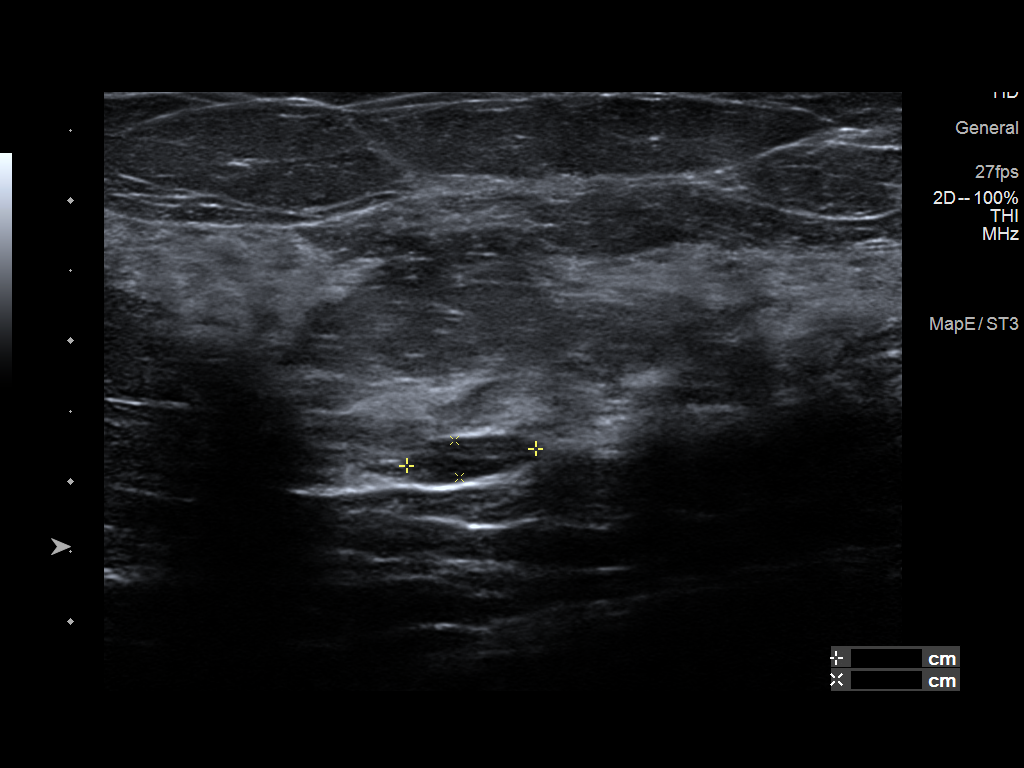
[im 3/5]
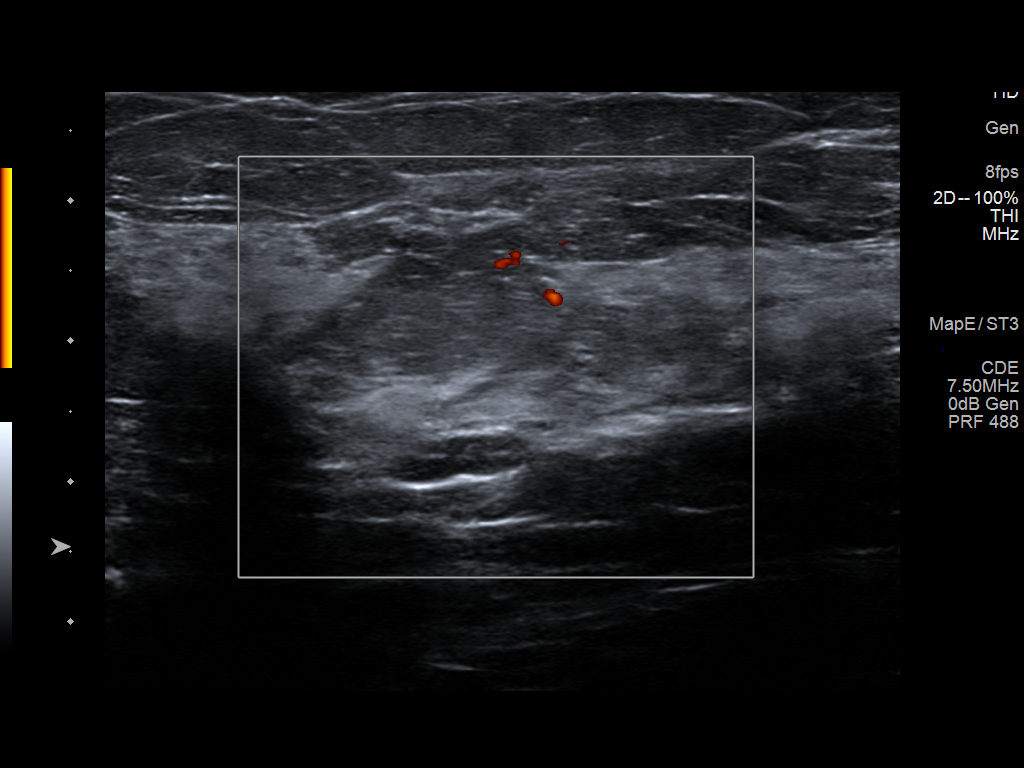
[im 4/5]
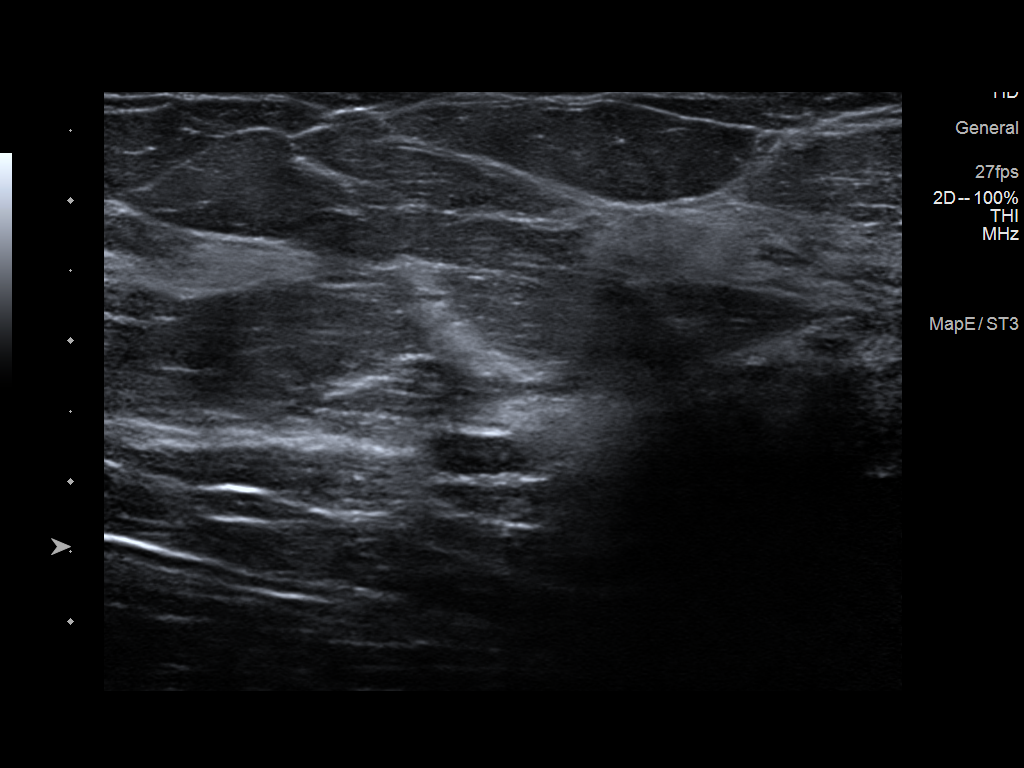
[im 5/5]
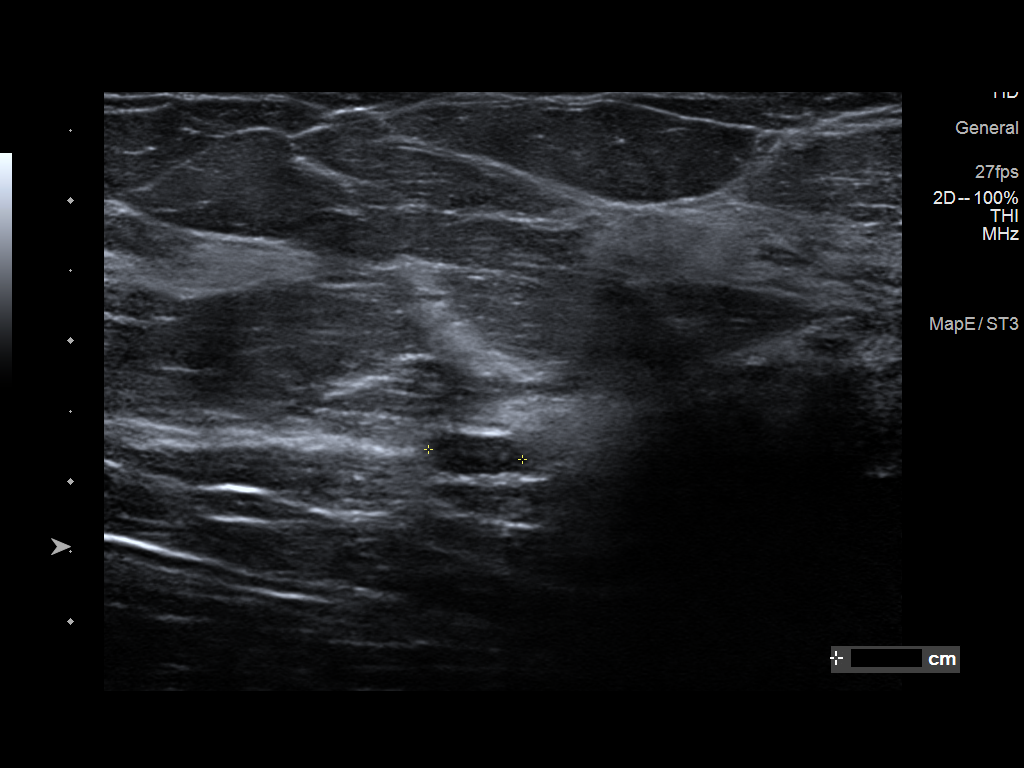

[5 of 5 positions shown; findings below may reference images not displayed]

ACR Breast Density Category c: The breast tissue is heterogeneously
dense, which may obscure small masses.
FINDINGS: The possible mass noted on the screening study persists on the spot
compression CC view, but is not well seen on the spot-compression
MLO view. Mass is of relatively low density with partly
circumscribed and partly obscured margins. It lies in the posterior
aspect of the breast near 12 o'clock.

Mammographic images were processed with CAD.

On physical exam, no masses palpated in the upper right breast.

Targeted ultrasound is performed, showing a small oval, parallel
cyst in the posterior aspect of the right breast at the 12:30
o'clock position, 7 cm the nipple, measuring 9 x 3 x 7 mm,
consistent in size, shape and location to the mammographic finding.
There are no other sonographic masses in the upper right breast.
IMPRESSION: 1. No evidence of breast malignancy.
2. Small benign cyst in the posterior, 12:30 o'clock position of the
right breast.

RECOMMENDATION:
Screening mammogram in one year.(Code:[id number])

I have discussed the findings and recommendations with the patient.
Results were also provided in writing at the conclusion of the
visit. If applicable, a reminder letter will be sent to the patient
regarding the next appointment.

BI-RADS CATEGORY  2: Benign.

## 2021-06-22 IMAGING — MG MM DIGITAL DIAGNOSTIC UNILAT*R* W/ TOMO W/ CAD
4 series · 4 of 12 positions shown · non-contrast
Comparison: 07/07/2019 and earlier

CLINICAL DATA: Patient returns after screening study for evaluation
of possible RIGHT breast distortion.

EXAM:
DIGITAL DIAGNOSTIC UNILATERAL RIGHT MAMMOGRAM WITH CAD AND TOMO

[R MLO synth-2D]
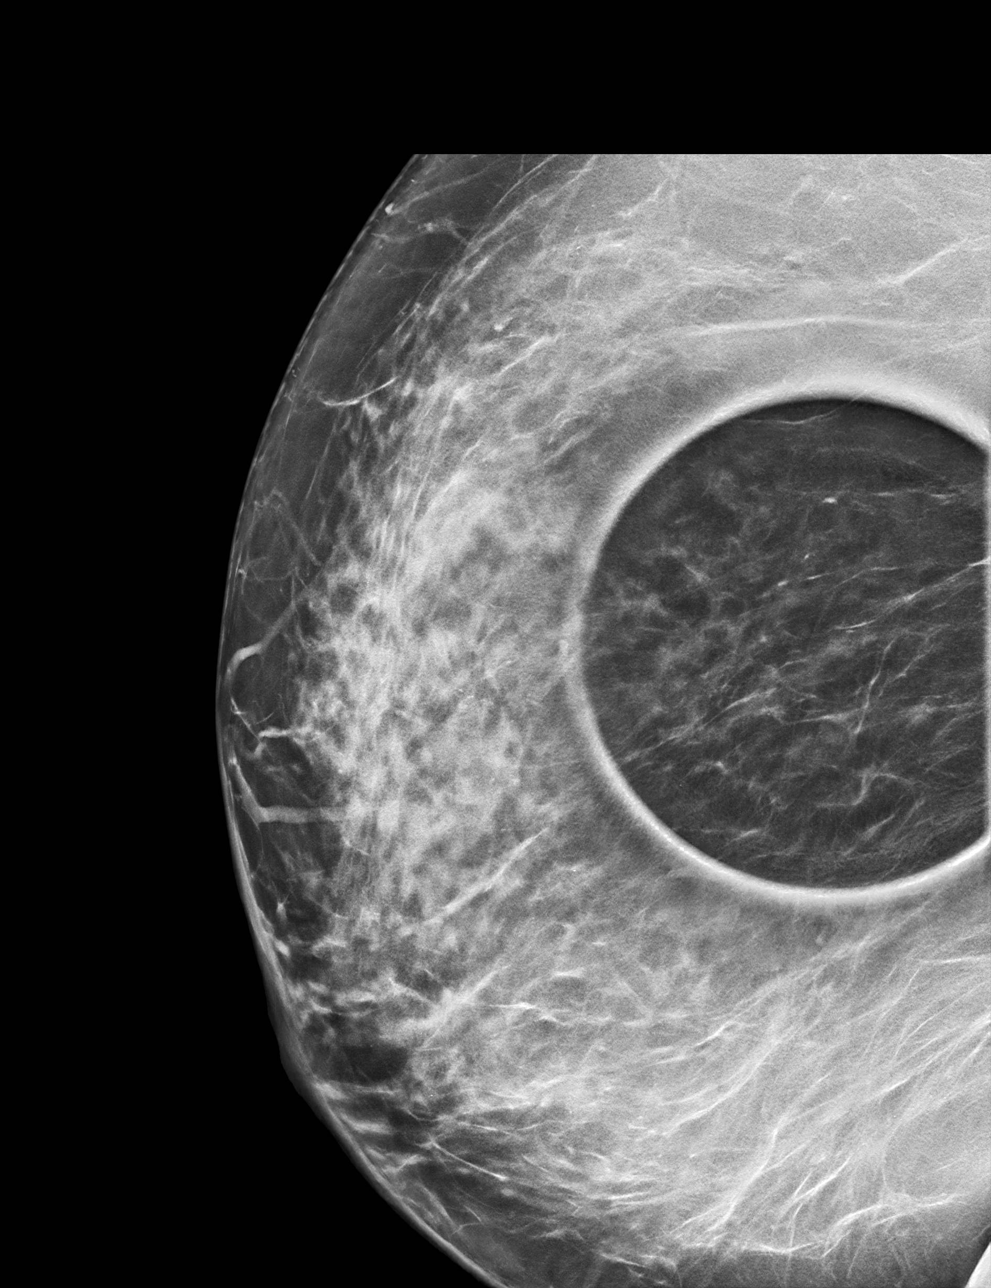

[R CC synth-2D]
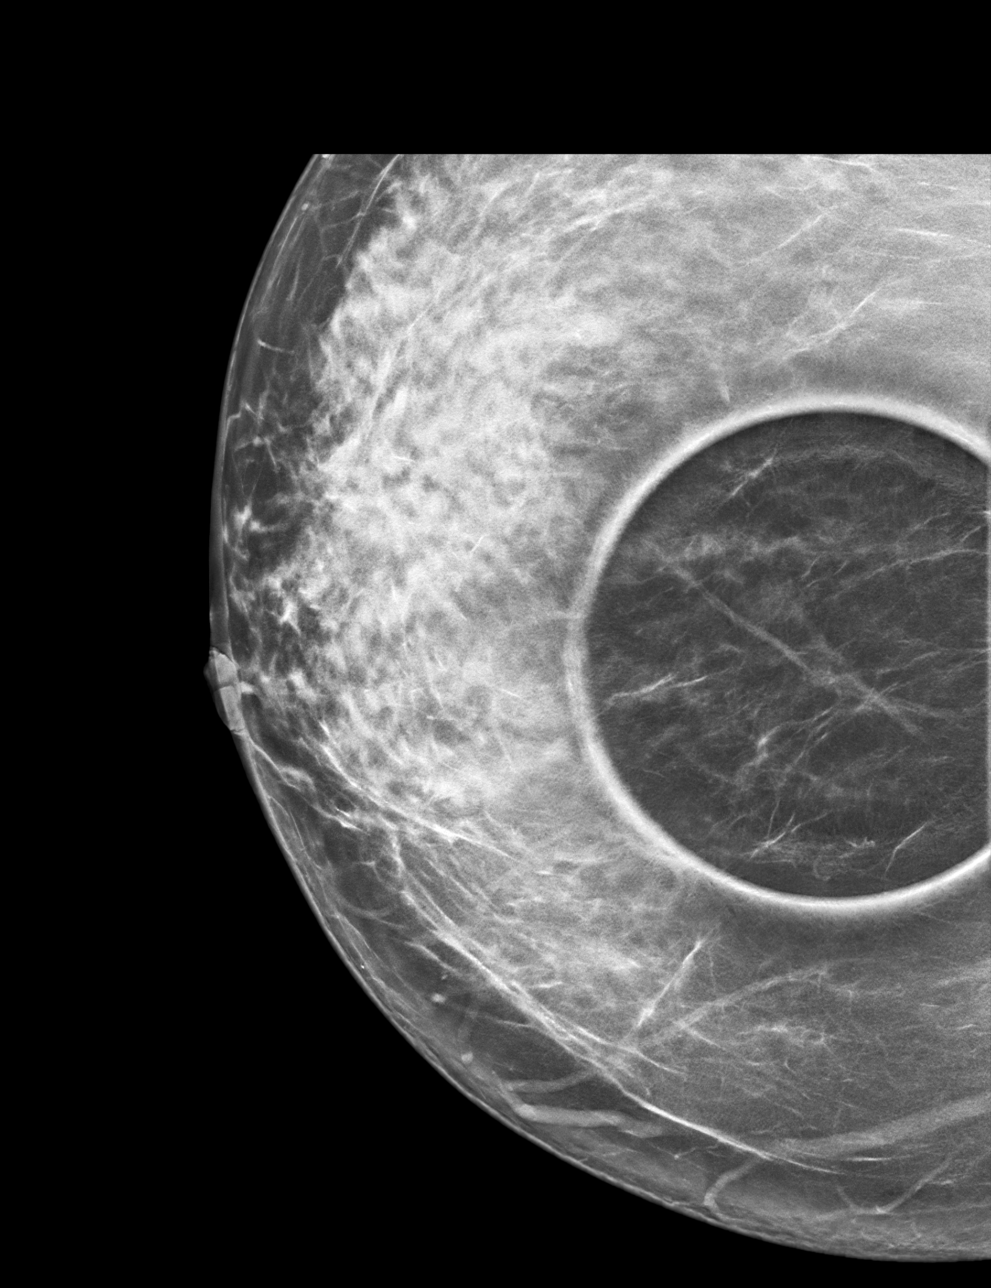

[R MLO tomo · tomo slice 39/78.0]
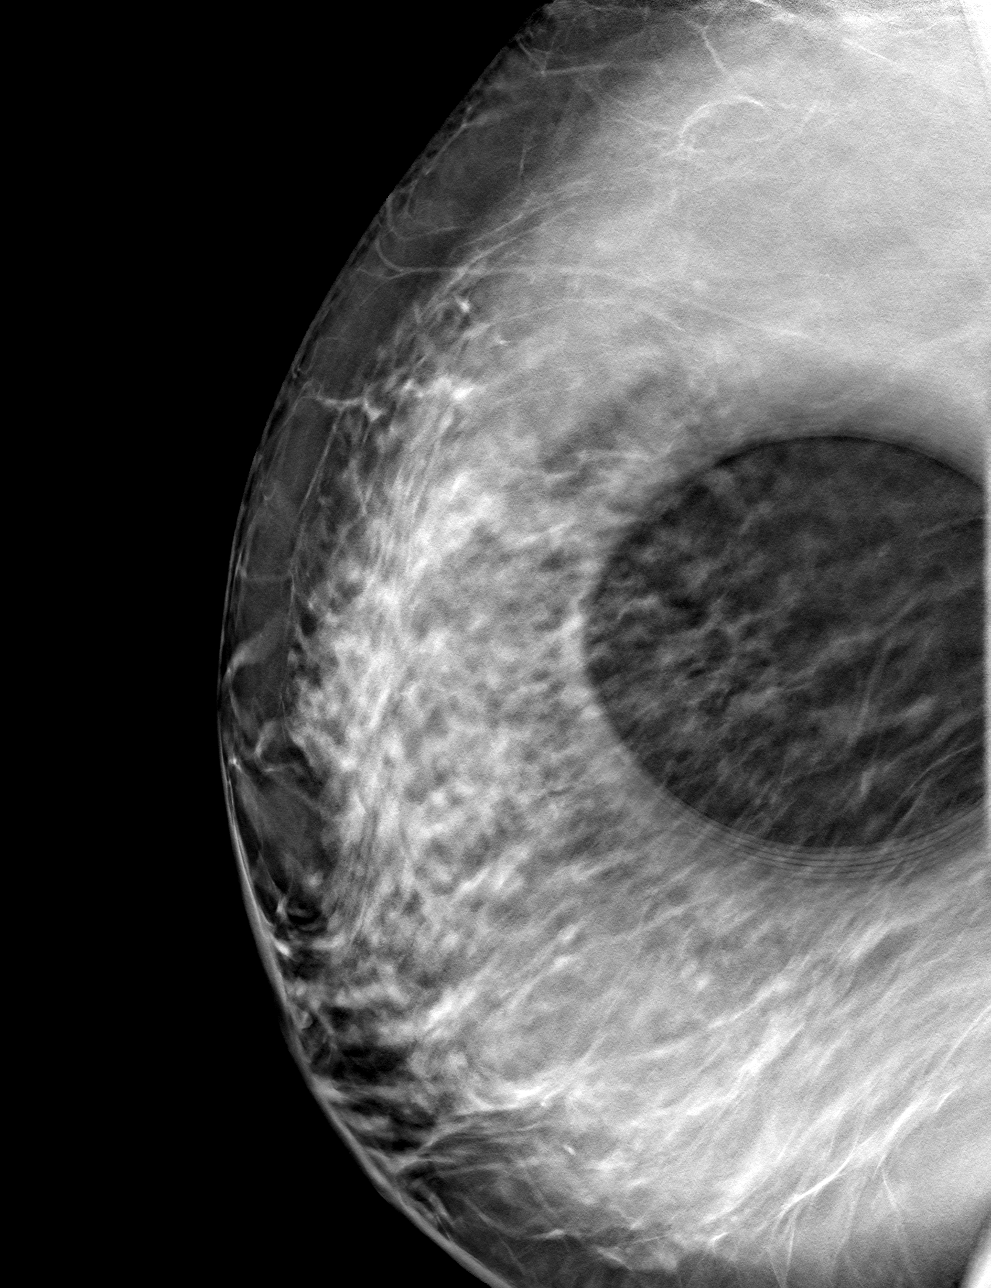

[R CC tomo · tomo slice 37/74.0]
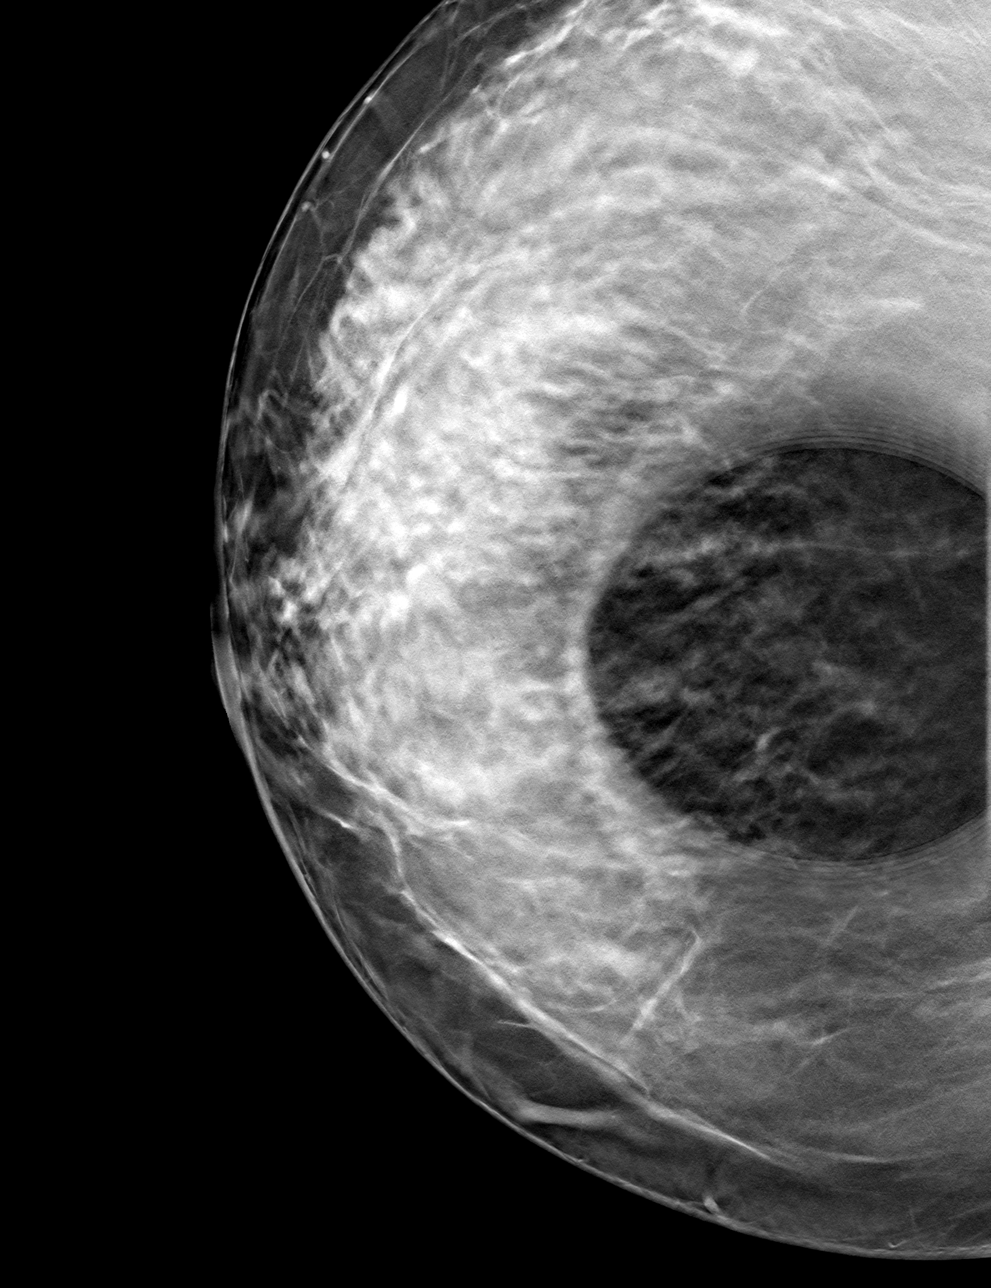

[4 of 12 positions shown; findings below may reference images not displayed]

ACR Breast Density Category c: The breast tissue is heterogeneously
dense, which may obscure small masses.
FINDINGS: Additional 2-D and 3-D images are performed. These views show no
persistent distortion in the posterior central portion of the RIGHT
breast. No suspicious mass, distortion, or microcalcifications are
identified to suggest presence of malignancy.

Mammographic images were processed with CAD.
IMPRESSION: No mammographic evidence for malignancy.

RECOMMENDATION:
Screening mammogram in one year.(Code:[id number])

I have discussed the findings and recommendations with the patient.
If applicable, a reminder letter will be sent to the patient
regarding the next appointment.

BI-RADS CATEGORY  1: Negative.
